# Patient Record
Sex: Male | Born: 1970 | Hispanic: No | Marital: Married | Smoking: Never smoker
Health system: Southern US, Community
[De-identification: ages and names within clinical notes are randomized; demographics above are authoritative.]

## PROBLEM LIST (undated history)

## (undated) DIAGNOSIS — Z9889 Other specified postprocedural states: Secondary | ICD-10-CM

## (undated) DIAGNOSIS — W5512XA Struck by horse, initial encounter: Secondary | ICD-10-CM

## (undated) DIAGNOSIS — R112 Nausea with vomiting, unspecified: Secondary | ICD-10-CM

## (undated) DIAGNOSIS — M1990 Unspecified osteoarthritis, unspecified site: Secondary | ICD-10-CM

## (undated) HISTORY — PX: CYSTOSCOPY: SUR368

## (undated) HISTORY — PX: COSMETIC SURGERY: SHX468

## (undated) HISTORY — DX: Struck by horse, initial encounter: W55.12XA

---

## 2010-05-09 ENCOUNTER — Ambulatory Visit (INDEPENDENT_AMBULATORY_CARE_PROVIDER_SITE_OTHER): Payer: 59 | Admitting: Internal Medicine

## 2010-05-09 ENCOUNTER — Encounter: Payer: Self-pay | Admitting: Internal Medicine

## 2010-05-09 VITALS — BP 110/80 | HR 66 | Temp 98.9°F | Ht 68.5 in | Wt 172.0 lb

## 2010-05-09 DIAGNOSIS — N3943 Post-void dribbling: Secondary | ICD-10-CM

## 2010-05-09 DIAGNOSIS — Z0000 Encounter for general adult medical examination without abnormal findings: Secondary | ICD-10-CM

## 2010-05-09 DIAGNOSIS — M25569 Pain in unspecified knee: Secondary | ICD-10-CM

## 2010-05-09 LAB — HEPATIC FUNCTION PANEL
Albumin: 4.5 g/dL (ref 3.5–5.2)
Total Bilirubin: 0.8 mg/dL (ref 0.3–1.2)

## 2010-05-09 LAB — POCT URINALYSIS DIPSTICK
Blood, UA: NEGATIVE
Ketones, UA: NEGATIVE
Protein, UA: NEGATIVE
Spec Grav, UA: 1.005

## 2010-05-09 LAB — LIPID PANEL
Cholesterol: 161 mg/dL (ref 0–200)
LDL Cholesterol: 102 mg/dL — ABNORMAL HIGH (ref 0–99)
Total CHOL/HDL Ratio: 5
VLDL: 29 mg/dL (ref 0.0–40.0)

## 2010-05-09 LAB — TSH: TSH: 2.75 u[IU]/mL (ref 0.35–5.50)

## 2010-05-09 LAB — CBC WITH DIFFERENTIAL/PLATELET
Basophils Relative: 0.5 % (ref 0.0–3.0)
Eosinophils Relative: 2.6 % (ref 0.0–5.0)
HCT: 49.2 % (ref 39.0–52.0)
Hemoglobin: 17.5 g/dL — ABNORMAL HIGH (ref 13.0–17.0)
Lymphs Abs: 1.9 10*3/uL (ref 0.7–4.0)
Monocytes Relative: 5.2 % (ref 3.0–12.0)
Neutro Abs: 4.7 10*3/uL (ref 1.4–7.7)
WBC: 7.1 10*3/uL (ref 4.5–10.5)

## 2010-05-09 LAB — BASIC METABOLIC PANEL
BUN: 16 mg/dL (ref 6–23)
Creatinine, Ser: 1.2 mg/dL (ref 0.4–1.5)
GFR: 71.7 mL/min (ref >60.00)
Potassium: 4.4 mEq/L (ref 3.5–5.1)

## 2010-05-09 MED ORDER — TETANUS-DIPHTH-ACELL PERTUSSIS 5-2-15.5 LF-MCG/0.5 IM SUSP: 0.5000 mL | Freq: Once | INTRAMUSCULAR | Status: DC

## 2010-05-09 NOTE — Patient Instructions (Signed)
Will notify you  of labs when available.   Will notify you about urology   Referral. Check up in a year or prn.

## 2010-05-11 ENCOUNTER — Encounter: Payer: Self-pay | Admitting: Internal Medicine

## 2010-05-11 DIAGNOSIS — N3943 Post-void dribbling: Secondary | ICD-10-CM | POA: Insufficient documentation

## 2010-05-11 DIAGNOSIS — M25569 Pain in unspecified knee: Secondary | ICD-10-CM | POA: Insufficient documentation

## 2010-05-11 NOTE — Progress Notes (Signed)
  Subjective:    Patient ID: Sergio Hampton, male    DOB: 1970/06/03, 40 y.o.   MRN: [id number]  HPI  Pateint comes in today for first time visit  And to get commercial drivers license renewed . He has papre woirk with this. He is generally well and no hops surgeries or recent ed visits.   No labs for years.  No falls .  Has smoke detector and wears seat belts.  No firearms. No excess sun exposure uses ocass sun screen. Sees dentist regularly . No depression  Review of Systems Neg heent, pulmonary no cp sob , Gi negative for bowel change or blood.GU: some post void dribbling for a while getting worse. No pain or stream changes but sometime takes a long time to void.  No pain. No excess nocturia. No back problems. Neuro Neg for weakness or numbness.  Knee bothers him at times with squats no swelling or giving out.    Objective:   Physical Exam BP 110/80  Pulse 66  Temp(Src) 98.9 F (37.2 C) (Oral)  Ht 5' 8.5" (1.74 m)  Wt 172 lb (78.019 kg)  BMI 25.77 kg/m2  General Appearance:    Alert, cooperative, no distress, appears stated age  Head:    Normocephalic, without obvious abnormality, atraumatic  Eyes:    PERRL, conjunctiva/corneas clear, EOM's intact, both eyes       Ears:    Normal TM's and external ear canals, both ears  Nose:   Nares normal, septum midline, mucosa normal, no drainage   or sinus tenderness  Throat:   Lips, mucosa, and tongue normal; teeth and gums normal  Neck:   Supple, symmetrical, trachea midline, no adenopathy;       thyroid:  No enlargement/tenderness/nodules; no carotid   bruit or JVD  Back:     Symmetric, no curvature, ROM normal, no CVA tenderness  Lungs:     Clear to auscultation bilaterally, respirations unlabored  Chest wall:    No tenderness or deformity  Heart:    Regular rate and rhythm, S1 and S2 normal, no murmur, rub   or gallop  Abdomen:     Soft, non-tender, bowel sounds active all four quadrants,    no masses, no organomegaly  Genitalia:     Normal male without lesion, discharge or tenderness  Rectal:   defered  Extremities:   Extremities normal, atraumatic, no cyanosis or edema  Pulses:   2+ and symmetric all extremities  Skin:   Skin color, texture, turgor normal, no rashes or lesions  Lymph nodes:   Cervical, supraclavicular, and axillary nodes normal  Neurologic:   CNII-XII intact. Normal strength, sensation and reflexes      Throughout  Peripheral vision and whisper test done for drivers exam.          Assessment & Plan:  Preventive  Health Care  No restrictions Disc  lifestyle intervention healthy eating and exercise .  Labs today not fasting Dribbling   Seems more than normal    Disc options and will get  Opinion. Knee discussed prevention fu if  persistent or progressive

## 2010-05-15 ENCOUNTER — Encounter: Payer: Self-pay | Admitting: *Deleted

## 2010-05-16 ENCOUNTER — Ambulatory Visit: Payer: Self-pay | Admitting: Internal Medicine

## 2011-03-20 ENCOUNTER — Encounter (HOSPITAL_COMMUNITY): Payer: Self-pay | Admitting: Anesthesiology

## 2011-03-20 ENCOUNTER — Encounter (HOSPITAL_COMMUNITY): Admission: EM | Disposition: A | Discharge status: Home / Self Care | Payer: Self-pay | Source: Home / Self Care

## 2011-03-20 ENCOUNTER — Emergency Department (HOSPITAL_COMMUNITY): Payer: 59

## 2011-03-20 ENCOUNTER — Other Ambulatory Visit (INDEPENDENT_AMBULATORY_CARE_PROVIDER_SITE_OTHER): Payer: Self-pay | Admitting: General Surgery

## 2011-03-20 ENCOUNTER — Encounter (HOSPITAL_COMMUNITY): Payer: Self-pay | Admitting: *Deleted

## 2011-03-20 ENCOUNTER — Emergency Department (HOSPITAL_COMMUNITY): Payer: 59 | Admitting: Anesthesiology

## 2011-03-20 ENCOUNTER — Inpatient Hospital Stay (HOSPITAL_COMMUNITY)
Admission: EM | Admit: 2011-03-20 | Discharge: 2011-03-22 | DRG: 343 | Disposition: A | Discharge status: Home / Self Care | Payer: 59 | Attending: General Surgery | Admitting: General Surgery

## 2011-03-20 DIAGNOSIS — R509 Fever, unspecified: Secondary | ICD-10-CM | POA: Diagnosis present

## 2011-03-20 DIAGNOSIS — K3580 Unspecified acute appendicitis: Principal | ICD-10-CM | POA: Diagnosis present

## 2011-03-20 HISTORY — DX: Nausea with vomiting, unspecified: R11.2

## 2011-03-20 HISTORY — PX: LAPAROSCOPIC APPENDECTOMY: SHX408

## 2011-03-20 HISTORY — DX: Other specified postprocedural states: Z98.890

## 2011-03-20 HISTORY — DX: Unspecified osteoarthritis, unspecified site: M19.90

## 2011-03-20 LAB — BASIC METABOLIC PANEL
BUN: 15 mg/dL (ref 6–23)
Calcium: 9.3 mg/dL (ref 8.4–10.5)
GFR calc Af Amer: >90 mL/min (ref >90)
GFR calc non Af Amer: 78 mL/min — ABNORMAL LOW (ref >90)
Glucose, Bld: 107 mg/dL — ABNORMAL HIGH (ref 70–99)
Sodium: 135 mEq/L (ref 135–145)

## 2011-03-20 LAB — CBC
MCH: 30.5 pg (ref 26.0–34.0)
MCHC: 35.9 g/dL (ref 30.0–36.0)
MCV: 84.8 fL (ref 78.0–100.0)
Platelets: 163 10*3/uL (ref 150–400)
RBC: 5.61 MIL/uL (ref 4.22–5.81)

## 2011-03-20 LAB — DIFFERENTIAL
Basophils Relative: 0 % (ref 0–1)
Eosinophils Absolute: 0 10*3/uL (ref 0.0–0.7)
Eosinophils Relative: 0 % (ref 0–5)
Lymphs Abs: 0.8 10*3/uL (ref 0.7–4.0)
Monocytes Relative: 6 % (ref 3–12)

## 2011-03-20 LAB — URINALYSIS, ROUTINE W REFLEX MICROSCOPIC
Bilirubin Urine: NEGATIVE
Hgb urine dipstick: NEGATIVE
Ketones, ur: NEGATIVE mg/dL
Protein, ur: NEGATIVE mg/dL
Urobilinogen, UA: 1 mg/dL (ref 0.0–1.0)

## 2011-03-20 SURGERY — APPENDECTOMY, LAPAROSCOPIC
Anesthesia: General | Site: Abdomen | Wound class: Contaminated

## 2011-03-20 MED ORDER — MOXIFLOXACIN HCL IN NACL 400 MG/250ML IV SOLN
400.0000 mg | Freq: Once | INTRAVENOUS | Status: AC
  Administered 2011-03-20: 400 mg via INTRAVENOUS
  Filled 2011-03-20: qty 250

## 2011-03-20 MED ORDER — MIDAZOLAM HCL 5 MG/5ML IJ SOLN
INTRAMUSCULAR | Status: DC | PRN
  Administered 2011-03-20: 2 mg via INTRAVENOUS

## 2011-03-20 MED ORDER — ONDANSETRON HCL 4 MG/2ML IJ SOLN
INTRAMUSCULAR | Status: DC | PRN
  Administered 2011-03-20: 4 mg via INTRAVENOUS

## 2011-03-20 MED ORDER — LACTATED RINGERS IR SOLN
Status: DC | PRN
  Administered 2011-03-20: 2000 mL

## 2011-03-20 MED ORDER — KETOROLAC TROMETHAMINE 30 MG/ML IJ SOLN
INTRAMUSCULAR | Status: AC
  Filled 2011-03-20: qty 1

## 2011-03-20 MED ORDER — KCL IN DEXTROSE-NACL 20-5-0.9 MEQ/L-%-% IV SOLN
INTRAVENOUS | Status: DC
Start: 2011-03-20 — End: 2011-03-22
  Administered 2011-03-20 – 2011-03-22 (×4): via INTRAVENOUS
  Filled 2011-03-20 (×8): qty 1000

## 2011-03-20 MED ORDER — FENTANYL CITRATE 0.05 MG/ML IJ SOLN: 25.0000 ug | INTRAMUSCULAR | Status: DC | PRN

## 2011-03-20 MED ORDER — ROCURONIUM BROMIDE 100 MG/10ML IV SOLN
INTRAVENOUS | Status: DC | PRN
  Administered 2011-03-20: 10 mg via INTRAVENOUS
  Administered 2011-03-20: 2 mg via INTRAVENOUS
  Administered 2011-03-20: 28 mg via INTRAVENOUS

## 2011-03-20 MED ORDER — ONDANSETRON HCL 4 MG PO TABS: 4.0000 mg | ORAL_TABLET | Freq: Four times a day (QID) | ORAL | Status: DC | PRN

## 2011-03-20 MED ORDER — LIDOCAINE HCL (CARDIAC) 20 MG/ML IV SOLN
INTRAVENOUS | Status: DC | PRN
  Administered 2011-03-20: 80 mg via INTRAVENOUS

## 2011-03-20 MED ORDER — OXYCODONE-ACETAMINOPHEN 5-325 MG PO TABS
1.0000 | ORAL_TABLET | ORAL | Status: DC | PRN
  Administered 2011-03-21 – 2011-03-22 (×6): 1 via ORAL
  Filled 2011-03-20 (×6): qty 1
  Filled 2011-03-20: qty 2

## 2011-03-20 MED ORDER — GLYCOPYRROLATE 0.2 MG/ML IJ SOLN
INTRAMUSCULAR | Status: DC | PRN
  Administered 2011-03-20: .6 mg via INTRAVENOUS

## 2011-03-20 MED ORDER — BUPIVACAINE HCL (PF) 0.5 % IJ SOLN
INTRAMUSCULAR | Status: DC | PRN
  Administered 2011-03-20: 12 mL

## 2011-03-20 MED ORDER — FENTANYL CITRATE 0.05 MG/ML IJ SOLN
INTRAMUSCULAR | Status: DC | PRN
  Administered 2011-03-20: 50 ug via INTRAVENOUS
  Administered 2011-03-20 (×2): 100 ug via INTRAVENOUS

## 2011-03-20 MED ORDER — MORPHINE SULFATE 2 MG/ML IJ SOLN: 2.0000 mg | INTRAMUSCULAR | Status: DC | PRN

## 2011-03-20 MED ORDER — PROPOFOL 10 MG/ML IV BOLUS
INTRAVENOUS | Status: DC | PRN
  Administered 2011-03-20: 200 mg via INTRAVENOUS

## 2011-03-20 MED ORDER — SUCCINYLCHOLINE CHLORIDE 20 MG/ML IJ SOLN
INTRAMUSCULAR | Status: DC | PRN
  Administered 2011-03-20: 100 mg via INTRAVENOUS

## 2011-03-20 MED ORDER — ONDANSETRON HCL 4 MG/2ML IJ SOLN: 4.0000 mg | Freq: Four times a day (QID) | INTRAMUSCULAR | Status: DC | PRN

## 2011-03-20 MED ORDER — KETOROLAC TROMETHAMINE 30 MG/ML IJ SOLN
15.0000 mg | Freq: Once | INTRAMUSCULAR | Status: AC | PRN
  Administered 2011-03-20: 30 mg via INTRAVENOUS

## 2011-03-20 MED ORDER — IOHEXOL 300 MG/ML  SOLN
100.0000 mL | Freq: Once | INTRAMUSCULAR | Status: AC | PRN
  Administered 2011-03-20: 100 mL via INTRAVENOUS

## 2011-03-20 MED ORDER — BUPIVACAINE HCL (PF) 0.5 % IJ SOLN
INTRAMUSCULAR | Status: AC
  Filled 2011-03-20: qty 30

## 2011-03-20 MED ORDER — NEOSTIGMINE METHYLSULFATE 1 MG/ML IJ SOLN
INTRAMUSCULAR | Status: DC | PRN
  Administered 2011-03-20: 4 mg via INTRAVENOUS

## 2011-03-20 MED ORDER — MEPERIDINE HCL 25 MG/ML IJ SOLN: 6.2500 mg | INTRAMUSCULAR | Status: DC | PRN

## 2011-03-20 MED ORDER — SODIUM CHLORIDE 0.9 % IV SOLN
Freq: Once | INTRAVENOUS | Status: AC
  Administered 2011-03-20 (×3): via INTRAVENOUS

## 2011-03-20 SURGICAL SUPPLY — 39 items
APPLIER CLIP 5 13 M/L LIGAMAX5 (MISCELLANEOUS) ×2
APPLIER CLIP ROT 10 11.4 M/L (STAPLE)
BENZOIN TINCTURE PRP APPL 2/3 (GAUZE/BANDAGES/DRESSINGS) ×2 IMPLANT
CANISTER SUCTION 2500CC (MISCELLANEOUS) ×2 IMPLANT
CHLORAPREP W/TINT 26ML (MISCELLANEOUS) ×2 IMPLANT
CLIP APPLIE 5 13 M/L LIGAMAX5 (MISCELLANEOUS) ×1 IMPLANT
CLIP APPLIE ROT 10 11.4 M/L (STAPLE) IMPLANT
CLOTH BEACON ORANGE TIMEOUT ST (SAFETY) ×2 IMPLANT
CUTTER FLEX LINEAR 45M (STAPLE) ×2 IMPLANT
DECANTER SPIKE VIAL GLASS SM (MISCELLANEOUS) IMPLANT
DRAPE LAPAROSCOPIC ABDOMINAL (DRAPES) ×2 IMPLANT
DRAPE UTILITY XL STRL (DRAPES) ×2 IMPLANT
DRSG TEGADERM 2-3/8X2-3/4 SM (GAUZE/BANDAGES/DRESSINGS) ×6 IMPLANT
ELECT REM PT RETURN 9FT ADLT (ELECTROSURGICAL) ×2
ELECTRODE REM PT RTRN 9FT ADLT (ELECTROSURGICAL) ×1 IMPLANT
ENDOLOOP SUT PDS II  0 18 (SUTURE)
ENDOLOOP SUT PDS II 0 18 (SUTURE) IMPLANT
GLOVE BIOGEL PI IND STRL 7.0 (GLOVE) ×1 IMPLANT
GLOVE BIOGEL PI INDICATOR 7.0 (GLOVE) ×1
GLOVE ECLIPSE 8.0 STRL XLNG CF (GLOVE) ×2 IMPLANT
GLOVE INDICATOR 8.0 STRL GRN (GLOVE) ×4 IMPLANT
GOWN STRL NON-REIN LRG LVL3 (GOWN DISPOSABLE) ×2 IMPLANT
GOWN STRL REIN XL XLG (GOWN DISPOSABLE) ×4 IMPLANT
KIT BASIN OR (CUSTOM PROCEDURE TRAY) ×2 IMPLANT
PENCIL BUTTON HOLSTER BLD 10FT (ELECTRODE) IMPLANT
POUCH SPECIMEN RETRIEVAL 10MM (ENDOMECHANICALS) ×2 IMPLANT
RELOAD 45 VASCULAR/THIN (ENDOMECHANICALS) IMPLANT
RELOAD STAPLE TA45 3.5 REG BLU (ENDOMECHANICALS) ×2 IMPLANT
SCALPEL HARMONIC ACE (MISCELLANEOUS) IMPLANT
SET IRRIG TUBING LAPAROSCOPIC (IRRIGATION / IRRIGATOR) ×2 IMPLANT
SOLUTION ANTI FOG 6CC (MISCELLANEOUS) ×2 IMPLANT
STRIP CLOSURE SKIN 1/2X4 (GAUZE/BANDAGES/DRESSINGS) ×2 IMPLANT
SUT MNCRL AB 4-0 PS2 18 (SUTURE) ×2 IMPLANT
TOWEL OR 17X26 10 PK STRL BLUE (TOWEL DISPOSABLE) ×2 IMPLANT
TRAY FOLEY CATH 14FRSI W/METER (CATHETERS) ×2 IMPLANT
TRAY LAP CHOLE (CUSTOM PROCEDURE TRAY) ×2 IMPLANT
TROCAR BLADELESS OPT 5 75 (ENDOMECHANICALS) ×4 IMPLANT
TROCAR XCEL BLUNT TIP 100MML (ENDOMECHANICALS) ×2 IMPLANT
TUBING INSUFFLATION 10FT LAP (TUBING) ×2 IMPLANT

## 2011-03-20 NOTE — Interval H&P Note (Signed)
History and Physical Interval Note:  03/20/2011 7:26 PM  Sergio Hampton  has presented today for surgery, with the diagnosis of appendicitis  The various methods of treatment have been discussed with the patient and family. After consideration of risks, benefits and other options for treatment, the patient has consented to  Procedure(s): APPENDECTOMY LAPAROSCOPIC as a surgical intervention .  The patients' history has been reviewed, patient examined, no change in status, stable for surgery.  I have reviewed the patients' chart and labs.  Questions were answered to the patient's satisfaction.     [name] [name]

## 2011-03-20 NOTE — ED Provider Notes (Signed)
Gradual onset of right lower quadrant pain with moderate localized tenderness with imaging consistent with appendicitis  Sergio Horn, MD 03/21/11 1328

## 2011-03-20 NOTE — Progress Notes (Signed)
Called to patient room by nurse tech, found pt sitting on toilet attempting to urinate, pt having moderate generalized shivering/shaking. Pt denies pain, dizzy or lightheaded, states no other signs or symptoms. Pt answers yes to feeling cold, skin appears flush. Pt appears to be in no respiratory distress. Pt continues to have shaking, unsteady gait while ambulating with one assist to bed. Vital signs stables. Pt given warm blanket and noted decrease in shivering, while resting quietly in bed. AC notified to check on pt. Pt stable and in no distress or shivering at this time. Patients RN notified and to continue to moniter. Instructed pt to notify nursing if any changes.

## 2011-03-20 NOTE — ED Notes (Signed)
Pt reports "bloated" feeling yesterday which then moved to RLQ and has not resolved. N/V, fever yesterday. Has been taking ibuprofen at home.

## 2011-03-20 NOTE — Transfer of Care (Signed)
Immediate Anesthesia Transfer of Care Note  Patient: Sergio Hampton  Procedure(s) Performed:  APPENDECTOMY LAPAROSCOPIC  Patient Location: PACU  Anesthesia Type: General  Level of Consciousness: awake, alert , oriented and sedated  Airway & Oxygen Therapy: Patient Spontanous Breathing and Patient connected to face mask oxygen  Post-op Assessment: Report given to PACU RN and Post -op Vital signs reviewed and stable  Post vital signs: Reviewed and stable  Complications: No apparent anesthesia complications

## 2011-03-20 NOTE — Anesthesia Postprocedure Evaluation (Signed)
  Anesthesia Post-op Note  Patient: Sergio Hampton  Procedure(s) Performed:  APPENDECTOMY LAPAROSCOPIC  Patient Location: PACU  Anesthesia Type: General  Level of Consciousness: awake and alert   Airway and Oxygen Therapy: Patient Spontanous Breathing  Post-op Pain: mild  Post-op Assessment: Post-op Vital signs reviewed, Patient's Cardiovascular Status Stable, Respiratory Function Stable, Patent Airway and No signs of Nausea or vomiting  Post-op Vital Signs: stable  Complications: No apparent anesthesia complications

## 2011-03-20 NOTE — Progress Notes (Signed)
Surgery called for pt before admission history could be completed by admission rn. Pt's rn will need to complete admission history when pt arrives to nursing unit. Sergio Hampton 03/20/2011

## 2011-03-20 NOTE — ED Notes (Signed)
Patient is resting comfortably.family at bedside. IV fluids started.

## 2011-03-20 NOTE — Anesthesia Preprocedure Evaluation (Signed)
Anesthesia Evaluation  Patient identified by MRN, date of birth, ID band Patient awake    Reviewed: Allergy & Precautions, H&P , NPO status , Patient's Chart, lab work & pertinent test results  Airway Mallampati: II TM Distance: >3 FB Neck ROM: Full    Dental No notable dental hx. (+) Dental Advisory Given   Pulmonary neg pulmonary ROS,  clear to auscultation  Pulmonary exam normal       Cardiovascular neg cardio ROS Regular Normal    Neuro/Psych Negative Neurological ROS  Negative Psych ROS   GI/Hepatic negative GI ROS, Neg liver ROS,   Endo/Other  Negative Endocrine ROS  Renal/GU negative Renal ROS  Genitourinary negative   Musculoskeletal negative musculoskeletal ROS (+)   Abdominal   Peds negative pediatric ROS (+)  Hematology negative hematology ROS (+)   Anesthesia Other Findings   Reproductive/Obstetrics negative OB ROS                           Anesthesia Physical Anesthesia Plan  ASA: I and Emergent  Anesthesia Plan: General   Post-op Pain Management:    Induction: Intravenous and Rapid sequence  Airway Management Planned: Oral ETT  Additional Equipment:   Intra-op Plan:   Post-operative Plan: Extubation in OR  Informed Consent: I have reviewed the patients History and Physical, chart, labs and discussed the procedure including the risks, benefits and alternatives for the proposed anesthesia with the patient or authorized representative who has indicated his/her understanding and acceptance.   Dental advisory given  Plan Discussed with: CRNA  Anesthesia Plan Comments:         Anesthesia Quick Evaluation

## 2011-03-20 NOTE — ED Provider Notes (Signed)
History     CSN: [id number]  Arrival date & time 03/20/11  1529   First MD Initiated Contact with Patient 03/20/11 1552      Chief Complaint  Patient presents with  . Abdominal Pain  . Nausea    (Consider location/radiation/quality/duration/timing/severity/associated sxs/prior treatment) Patient is a 40 y.o. male presenting with abdominal pain. The history is provided by the patient.  Abdominal Pain The primary symptoms of the illness include abdominal pain, nausea and vomiting. The primary symptoms of the illness do not include fever, shortness of breath, diarrhea, hematemesis or dysuria. The current episode started more than 2 days ago. The onset of the illness was gradual. The problem has been gradually worsening.  Symptoms associated with the illness do not include chills, constipation, urgency, hematuria or frequency.  pt states 3 days ago developed diffuse abdominal pain, bloating, felt like gas. States today, pain localized to the right lower quadrant. Admits to nausea, vomiting x1 today, felt feverish. Deneis URI symptoms. Took ibuprofen today with no relief. Admits to no appetite. Normal BM yesterday, no urinary symptoms.   History reviewed. No pertinent past medical history.  Past Surgical History  Procedure Date  . Cosmetic surgery     on face    Family History  Problem Relation Age of Onset  . Arthritis Mother   . Autoimmune disease Mother     of liver  . Atrial fibrillation Father   . Thyroid cancer Father   . Lung cancer Father   . Asthma Brother     History  Substance Use Topics  . Smoking status: Never Smoker   . Smokeless tobacco: Not on file  . Alcohol Use: Yes     1x/week      Review of Systems  Constitutional: Negative for fever and chills.  HENT: Negative.   Eyes: Negative.   Respiratory: Negative.  Negative for shortness of breath.   Cardiovascular: Negative.   Gastrointestinal: Positive for nausea, vomiting and abdominal pain. Negative  for diarrhea, constipation, blood in stool and hematemesis.  Genitourinary: Negative for dysuria, urgency, frequency, hematuria, flank pain and testicular pain.  Musculoskeletal: Negative.   Skin: Negative.   Neurological: Negative.   Psychiatric/Behavioral: Negative.     Allergies  Review of patient's allergies indicates no known allergies.  Home Medications   Current Outpatient Rx  Name Route Sig Dispense Refill  . IBUPROFEN 200 MG PO TABS Oral Take 400 mg by mouth every 6 (six) hours as needed. For generic pain relief       BP 123/68  Pulse 92  Temp(Src) 99.2 F (37.3 C) (Oral)  Resp 18  SpO2 96%  Physical Exam  Nursing note and vitals reviewed. Constitutional: He is oriented to person, place, and time. He appears well-developed and well-nourished.  Neck: Neck supple.  Cardiovascular: Normal rate, regular rhythm and normal heart sounds.   Pulmonary/Chest: Effort normal and breath sounds normal.  Abdominal: Soft. Bowel sounds are normal. There is tenderness in the right lower quadrant. There is tenderness at McBurney's point. There is no rebound and no guarding.  Musculoskeletal: Normal range of motion. He exhibits no edema.  Neurological: He is alert and oriented to person, place, and time.  Skin: Skin is warm and dry. No rash noted.  Psychiatric: He has a normal mood and affect.    ED Course  Procedures (including critical care time) Exam concerning for possible appendicitis. Will do blood work, CT abd/pelvis for further evaluation.   Results for orders placed during  the hospital encounter of 03/20/11  CBC      Component Value Range   WBC 9.9  4.0 - 10.5 (K/uL)   RBC 5.61  4.22 - 5.81 (MIL/uL)   Hemoglobin 17.1 (*) 13.0 - 17.0 (g/dL)   HCT 16.1  09.6 - 04.5 (%)   MCV 84.8  78.0 - 100.0 (fL)   MCH 30.5  26.0 - 34.0 (pg)   MCHC 35.9  30.0 - 36.0 (g/dL)   RDW 40.9  81.1 - 91.4 (%)   Platelets 163  150 - 400 (K/uL)  DIFFERENTIAL      Component Value Range    Neutrophils Relative 86 (*) 43 - 77 (%)   Neutro Abs 8.5 (*) 1.7 - 7.7 (K/uL)   Lymphocytes Relative 8 (*) 12 - 46 (%)   Lymphs Abs 0.8  0.7 - 4.0 (K/uL)   Monocytes Relative 6  3 - 12 (%)   Monocytes Absolute 0.6  0.1 - 1.0 (K/uL)   Eosinophils Relative 0  0 - 5 (%)   Eosinophils Absolute 0.0  0.0 - 0.7 (K/uL)   Basophils Relative 0  0 - 1 (%)   Basophils Absolute 0.0  0.0 - 0.1 (K/uL)  BASIC METABOLIC PANEL      Component Value Range   Sodium 135  135 - 145 (mEq/L)   Potassium 3.3 (*) 3.5 - 5.1 (mEq/L)   Chloride 101  96 - 112 (mEq/L)   CO2 26  19 - 32 (mEq/L)   Glucose, Bld 107 (*) 70 - 99 (mg/dL)   BUN 15  6 - 23 (mg/dL)   Creatinine, Ser 7.82  0.50 - 1.35 (mg/dL)   Calcium 9.3  8.4 - 95.6 (mg/dL)   GFR calc non Af Amer 78 (*) >90 (mL/min)   GFR calc Af Amer >90  >90 (mL/min)  URINALYSIS, ROUTINE W REFLEX MICROSCOPIC      Component Value Range   Color, Urine YELLOW  YELLOW    APPearance CLEAR  CLEAR    Specific Gravity, Urine 1.039 (*) 1.005 - 1.030    pH 6.5  5.0 - 8.0    Glucose, UA NEGATIVE  NEGATIVE (mg/dL)   Hgb urine dipstick NEGATIVE  NEGATIVE    Bilirubin Urine NEGATIVE  NEGATIVE    Ketones, ur NEGATIVE  NEGATIVE (mg/dL)   Protein, ur NEGATIVE  NEGATIVE (mg/dL)   Urobilinogen, UA 1.0  0.0 - 1.0 (mg/dL)   Nitrite NEGATIVE  NEGATIVE    Leukocytes, UA NEGATIVE  NEGATIVE    Ct Abdomen Pelvis W Contrast  03/20/2011  *RADIOLOGY REPORT*  Clinical Data: Right lower quadrant pain.  CT ABDOMEN AND PELVIS WITH CONTRAST  Technique:  Multidetector CT imaging of the abdomen and pelvis was performed following the standard protocol during bolus administration of intravenous contrast.  Contrast: OMNIPAQUE IOHEXOL 300 MG/ML IV SOLN  Comparison: None.  Findings: Calcified granuloma is seen in the posterior liver. Liver and spleen are otherwise unremarkable.  The stomach, duodenum, pancreas, gallbladder, adrenal glands, and kidneys are unremarkable.  No abdominal aortic aneurysm.   No free fluid or lymphadenopathy in the abdomen.  Imaging through the pelvis shows no free intraperitoneal fluid.  No pelvic sidewall lymphadenopathy.  Bladder is normal.  No colonic diverticulitis.  The base to the terminal ileum is normal.  The appendix measures 11 mm in diameter.  There is periappendiceal edema/inflammation.  No extraluminal gas.  No abscess.  Bone windows reveal no worrisome lytic or sclerotic osseous lesions.  IMPRESSION: Imaging findings  consistent with acute appendicitis.  No evidence for extraluminal air.  No abscess.  I personally called these results directly to Jaynie Crumble at 1807 hours on 03/20/2011.  Original Report Authenticated By: ERIC A. MANSELL, M.D.    CT consistent with acute appendicitis. Spoke with Dr. Abbey Chatters, will be taking to surgery. Asked to give avelox 400mg  iv. Results discussed with pt.  MDM          Lottie Mussel, PA 03/21/11 [phone]

## 2011-03-20 NOTE — H&P (Signed)
Sergio Hampton is an 40 y.o. male.   Chief Complaint: Abdominal pain HPI:  This is a 40 year old male with the onset of diffuse pressure-like abdominal pain 2 days ago. The pain improved a little bit with a bowel movement. Yesterday, the pain migrated to the right lower quadrant and was associated with a fever and nausea and vomiting. The pain progressively worsened and he presented to the emergency department for evaluation. He was discovered to have acute appendicitis and I was asked to see him.  History reviewed. No pertinent past medical history.  Past Surgical History  Procedure Date  . Cosmetic surgery     on face    Family History  Problem Relation Age of Onset  . Arthritis Mother   . Autoimmune disease Mother     of liver  . Atrial fibrillation Father   . Thyroid cancer Father   . Lung cancer Father   . Asthma Brother    Social History:  reports that he has never smoked. He does not have any smokeless tobacco history on file. He reports that he drinks alcohol. He reports that he does not use illicit drugs.  Allergies: No Known Allergies  Medications Prior to Admission  Medication Dose Route Frequency Provider Last Rate Last Dose  . 0.9 %  sodium chloride infusion   Intravenous Once Tatyana A Kirichenko, PA 100 mL/hr at 03/20/11 1656    . iohexol (OMNIPAQUE) 300 MG/ML solution 100 mL  100 mL Intravenous Once PRN Medication Radiologist   100 mL at 03/20/11 1804  . moxifloxacin (AVELOX) IVPB 400 mg  400 mg Intravenous Once Tatyana A Kirichenko, PA   400 mg at 03/20/11 1847  . TDaP (ADACEL) injection 0.5 mL  0.5 mL Intramuscular Once Simmie Davies Panosh       No current outpatient prescriptions on file as of 03/20/2011.    Results for orders placed during the hospital encounter of 03/20/11 (from the past 48 hour(s))  CBC     Status: Abnormal   Collection Time   03/20/11  4:20 PM      Component Value Range Comment   WBC 9.9  4.0 - 10.5 (K/uL)    RBC 5.61  4.22 - 5.81  (MIL/uL)    Hemoglobin 17.1 (*) 13.0 - 17.0 (g/dL)    HCT 16.1  09.6 - 04.5 (%)    MCV 84.8  78.0 - 100.0 (fL)    MCH 30.5  26.0 - 34.0 (pg)    MCHC 35.9  30.0 - 36.0 (g/dL)    RDW 40.9  81.1 - 91.4 (%)    Platelets 163  150 - 400 (K/uL)   DIFFERENTIAL     Status: Abnormal   Collection Time   03/20/11  4:20 PM      Component Value Range Comment   Neutrophils Relative 86 (*) 43 - 77 (%)    Neutro Abs 8.5 (*) 1.7 - 7.7 (K/uL)    Lymphocytes Relative 8 (*) 12 - 46 (%)    Lymphs Abs 0.8  0.7 - 4.0 (K/uL)    Monocytes Relative 6  3 - 12 (%)    Monocytes Absolute 0.6  0.1 - 1.0 (K/uL)    Eosinophils Relative 0  0 - 5 (%)    Eosinophils Absolute 0.0  0.0 - 0.7 (K/uL)    Basophils Relative 0  0 - 1 (%)    Basophils Absolute 0.0  0.0 - 0.1 (K/uL)   BASIC METABOLIC PANEL     Status:  Abnormal   Collection Time   03/20/11  4:20 PM      Component Value Range Comment   Sodium 135  135 - 145 (mEq/L)    Potassium 3.3 (*) 3.5 - 5.1 (mEq/L)    Chloride 101  96 - 112 (mEq/L)    CO2 26  19 - 32 (mEq/L)    Glucose, Bld 107 (*) 70 - 99 (mg/dL)    BUN 15  6 - 23 (mg/dL)    Creatinine, Ser 1.61  0.50 - 1.35 (mg/dL)    Calcium 9.3  8.4 - 10.5 (mg/dL)    GFR calc non Af Amer 78 (*) >90 (mL/min)    GFR calc Af Amer >90  >90 (mL/min)   URINALYSIS, ROUTINE W REFLEX MICROSCOPIC     Status: Abnormal   Collection Time   03/20/11  6:32 PM      Component Value Range Comment   Color, Urine YELLOW  YELLOW     APPearance CLEAR  CLEAR     Specific Gravity, Urine 1.039 (*) 1.005 - 1.030     pH 6.5  5.0 - 8.0     Glucose, UA NEGATIVE  NEGATIVE (mg/dL)    Hgb urine dipstick NEGATIVE  NEGATIVE     Bilirubin Urine NEGATIVE  NEGATIVE     Ketones, ur NEGATIVE  NEGATIVE (mg/dL)    Protein, ur NEGATIVE  NEGATIVE (mg/dL)    Urobilinogen, UA 1.0  0.0 - 1.0 (mg/dL)    Nitrite NEGATIVE  NEGATIVE     Leukocytes, UA NEGATIVE  NEGATIVE  MICROSCOPIC NOT DONE ON URINES WITH NEGATIVE PROTEIN, BLOOD, LEUKOCYTES, NITRITE,  OR GLUCOSE <1000 mg/dL.   Ct Abdomen Pelvis W Contrast  03/20/2011  *RADIOLOGY REPORT*  Clinical Data: Right lower quadrant pain.  CT ABDOMEN AND PELVIS WITH CONTRAST  Technique:  Multidetector CT imaging of the abdomen and pelvis was performed following the standard protocol during bolus administration of intravenous contrast.  Contrast: OMNIPAQUE IOHEXOL 300 MG/ML IV SOLN  Comparison: None.  Findings: Calcified granuloma is seen in the posterior liver. Liver and spleen are otherwise unremarkable.  The stomach, duodenum, pancreas, gallbladder, adrenal glands, and kidneys are unremarkable.  No abdominal aortic aneurysm.  No free fluid or lymphadenopathy in the abdomen.  Imaging through the pelvis shows no free intraperitoneal fluid.  No pelvic sidewall lymphadenopathy.  Bladder is normal.  No colonic diverticulitis.  The base to the terminal ileum is normal.  The appendix measures 11 mm in diameter.  There is periappendiceal edema/inflammation.  No extraluminal gas.  No abscess.  Bone windows reveal no worrisome lytic or sclerotic osseous lesions.  IMPRESSION: Imaging findings consistent with acute appendicitis.  No evidence for extraluminal air.  No abscess.  I personally called these results directly to Jaynie Crumble at 1807 hours on 03/20/2011.  Original Report Authenticated By: ERIC A. MANSELL, M.D.    Review of Systems  Constitutional: Positive for fever and chills.  HENT: Positive for neck pain (chronic). Negative for congestion and sore throat.   Respiratory: Negative for cough and shortness of breath.   Cardiovascular: Negative for chest pain.  Gastrointestinal: Positive for nausea, vomiting and abdominal pain. Negative for heartburn and constipation.  Genitourinary: Positive for frequency (chronic). Negative for dysuria and hematuria.  Neurological: Positive for headaches. Negative for seizures.  Endo/Heme/Allergies: Does not bruise/bleed easily.    Blood pressure 127/74, pulse  94, temperature 99.3 F (37.4 C), temperature source Oral, resp. rate 20, SpO2 99.00%. Physical Exam  Constitutional: He  appears well-developed and well-nourished. No distress.  HENT:  Head: Normocephalic and atraumatic.  Eyes: Conjunctivae and EOM are normal.  Neck: Neck supple. No tracheal deviation present.  Cardiovascular: Normal rate and regular rhythm.   No murmur heard. Respiratory: Effort normal and breath sounds normal.  GI: Soft. He exhibits distension (mild). He exhibits no mass. There is tenderness (rlq). There is guarding (rlq).  Musculoskeletal: Normal range of motion. He exhibits no edema.  Lymphadenopathy:    He has no cervical adenopathy.  Skin: Skin is warm and dry.  Psychiatric: He has a normal mood and affect. His behavior is normal.     Assessment/Plan Acute appendicitis without evidence of perforation.  Plan: Laparoscopic possible open appendectomy. Intravenous antibiotics.  I have explained the procedure, risks, and aftercare to him.  Risks include but are not limited to bleeding, infection, anesthesia, wound problems, axonal injury to surrounding organs. He seems to understand this and agrees with the plan.  Brean Carberry J 03/20/2011, 7:21 PM

## 2011-03-20 NOTE — Op Note (Signed)
Operative Note  Sergio Hampton male 40 y.o. 03/20/2011  PREOPERATIVE DX:  Acute appendicitis  POSTOPERATIVE DX:  Same  PROCEDURE:  Laparoscopic appendectomy         Surgeon: Adolph Pollack   Assistants: None  Anesthesia: General endotracheal anesthesia  Indications: This is a 40 year old male who presented to the emergency department with a two-day history of abdominal pain that began diffusely and radiated down to the right lower quadrant. He was found to have acute appendicitis and is now brought to the operating room for laparoscopic appendectomy.   Procedure Detail:  He was brought to the operating room placed supine on the operating table and a general anesthetic was administered. A Foley catheter was inserted. The hair and abdominal wall was clipped. The abdominal wall was widely sterilely prepped and draped.  Dilute Marcaine solution was infiltrated in the subumbilical region. A small subumbilical incision was made through the skin subcutaneous tissue fascia and peritoneum entered the peritoneal cavity under direct vision. A pursestring suture of 0 Vicryl was placed on the edges of the fascia. A Hassan trocar was introduced into the peritoneal cavity and pneumoperitoneum was created. The laparoscope was introduced and there is no underlying bleeding or organ injury. An acute inflammatory process in the right lower quadrant was noted with no evidence of perforation.  A 5 mm trocar was placed in the left lower quadrant and the right upper quadrant. Using blunt dissection, the appendix was separated from it an inflammatory lesion to the inferior mesenteric fat pad of the distal ileum. Using the harmonic scalpel, the mesentery of the appendix was divided down to the base of the cecum. Using an Endo GIA stapler, the appendix was amputated off the cecum with a small cuff of cecum. The appendix was placed in a retrieval bag and removed through the subumbilical incision. The subumbilical  port was replaced.  The right lower quadrant pelvic area were copiously irrigated with saline solution. A small amount of bleeding was noted from the staple line which was controlled with hemoclips. Further inspection of the staple line showed it to be hemostatic without evidence of leak. More irrigation was performed and the fluid evacuated.  The left lower quadrant trocar was removed and no bleeding was noted. The subumbilical trocar was removed and the fascial defect was closed by tightening up and tying down of Vicryl suture.  The CO2 gas was released and the 5 mm right upper quadrant trocar was removed.  All skin incisions were closed with 4-0 Monocryl subcuticular stitches followed by Steri-Strips and sterile dressings. He tolerated the procedure well without any apparent complications and was taken to the recovery room in satisfactory condition.    Estimated Blood Loss:  less than 100 mL         Drains: none          Blood Given: none          Specimens: Appendix        Complications:  * No complications entered in OR log *         Disposition: PACU - hemodynamically stable.         Condition: stable

## 2011-03-21 MED ORDER — ACETAMINOPHEN 325 MG PO TABS
650.0000 mg | ORAL_TABLET | Freq: Once | ORAL | Status: AC
  Administered 2011-03-21: 650 mg via ORAL
  Filled 2011-03-21: qty 2

## 2011-03-21 MED ORDER — SODIUM CHLORIDE 0.9 % IV SOLN
1.0000 g | Freq: Once | INTRAVENOUS | Status: AC
  Administered 2011-03-21: 1 g via INTRAVENOUS
  Filled 2011-03-21: qty 1

## 2011-03-21 NOTE — Progress Notes (Signed)
Notified by nurse tech that the patient's temperature was 102.9 F orally. Other vital signs were as follows: Pulse- 117 Respirations-22 Blood Pressure- 118/75 O2 sat- 100% on 2L nasal cannula.   Patient was alert and oriented, dressing sites were dry/intact, patient has no complaints of pain. Notified MD on call about patient's temperature. Was instructed to monitor by MD and will continue to do so.  Samara Snide [facility] 03/21/2011 1:41 AM

## 2011-03-21 NOTE — ED Provider Notes (Signed)
Medical screening examination/treatment/procedure(s) were conducted as a shared visit with non-physician practitioner(s) and myself.  I personally evaluated the patient during the encounter  Hurman Horn, MD 03/21/11 1315

## 2011-03-21 NOTE — Progress Notes (Signed)
1 Day Post-Op  Subjective: Sore this AM. Taking sips of clears but not much else yet. Voiding OK  Objective: Vital signs in last 24 hours: Temp:  [98 F (36.7 C)-102.9 F (39.4 C)] 99.9 F (37.7 C) (12/26 0546) Pulse Rate:  [84-117] 104  (12/26 0546) Resp:  [18-22] 20  (12/26 0546) BP: (105-137)/(67-82) 107/69 mmHg (12/26 0546) SpO2:  [94 %-100 %] 94 % (12/26 0546) Weight:  [172 lb (78.019 kg)-183 lb 10.3 oz (83.3 kg)] 183 lb 10.3 oz (83.3 kg) (12/25 2215) Last BM Date:  (PTA)  Intake/Output from previous day: 12/25 0701 - 12/26 0700 In: 2242 [I.V.:2242] Out: 500 [Urine:500] Intake/Output this shift:    General appearance: alert, mild distress and Looks mildly uncomfortable Resp: clear to auscultation bilaterally GI: Abdomen is soft, mildly distended, tender at incisions;dressing dry, occacional BS  Lab Results:   [facility] 03/20/11 1620  WBC 9.9  HGB 17.1*  HCT 47.6  PLT 163   BMET  Basename 03/20/11 1620  NA 135  K 3.3*  CL 101  CO2 26  GLUCOSE 107*  BUN 15  CREATININE 1.16  CALCIUM 9.3   PT/INR No results found for this basename: LABPROT:2,INR:2 in the last 72 hours ABG No results found for this basename: PHART:2,PCO2:2,PO2:2,HCO3:2 in the last 72 hours  Studies/Results: Ct Abdomen Pelvis W Contrast  03/20/2011  *RADIOLOGY REPORT*  Clinical Data: Right lower quadrant pain.  CT ABDOMEN AND PELVIS WITH CONTRAST  Technique:  Multidetector CT imaging of the abdomen and pelvis was performed following the standard protocol during bolus administration of intravenous contrast.  Contrast: OMNIPAQUE IOHEXOL 300 MG/ML IV SOLN  Comparison: None.  Findings: Calcified granuloma is seen in the posterior liver. Liver and spleen are otherwise unremarkable.  The stomach, duodenum, pancreas, gallbladder, adrenal glands, and kidneys are unremarkable.  No abdominal aortic aneurysm.  No free fluid or lymphadenopathy in the abdomen.  Imaging through the pelvis shows no free  intraperitoneal fluid.  No pelvic sidewall lymphadenopathy.  Bladder is normal.  No colonic diverticulitis.  The base to the terminal ileum is normal.  The appendix measures 11 mm in diameter.  There is periappendiceal edema/inflammation.  No extraluminal gas.  No abscess.  Bone windows reveal no worrisome lytic or sclerotic osseous lesions.  IMPRESSION: Imaging findings consistent with acute appendicitis.  No evidence for extraluminal air.  No abscess.  I personally called these results directly to Jaynie Crumble at 1807 hours on 03/20/2011.  Original Report Authenticated By: ERIC A. MANSELL, M.D.    Anti-infectives: Anti-infectives     Start     Dose/Rate Route Frequency Ordered Stop   03/20/11 1830   moxifloxacin (AVELOX) IVPB 400 mg        400 mg 250 mL/hr over 60 Minutes Intravenous  Once 03/20/11 1824 03/20/11 1947          Assessment/Plan: s/p Procedure(s): APPENDECTOMY LAPAROSCOPIC Advance diet Will need to keep today. Had 102 fever so will continue antibiotic for 24H post op  LOS: 1 day    Seila Liston J 03/21/2011

## 2011-03-21 NOTE — Progress Notes (Signed)
Was called to patient's room to assess pt's shivering/shaking. Pt was laying in bed and the shivering/shaking was moderate, shaking the bed as well. Vital signs were stable, heart rate was 112. Patient was alert and oriented, no complaints of pain. Notified MD on call, and received an order for 650 mg Tylenol POx1. Notified patient, and will continue to monitor after giving PO Tylenol.  Samara Snide [facility] 03/21/2011

## 2011-03-22 ENCOUNTER — Telehealth (INDEPENDENT_AMBULATORY_CARE_PROVIDER_SITE_OTHER): Payer: Self-pay

## 2011-03-22 ENCOUNTER — Telehealth: Payer: Self-pay | Admitting: Internal Medicine

## 2011-03-22 ENCOUNTER — Encounter (HOSPITAL_COMMUNITY): Payer: Self-pay | Admitting: General Surgery

## 2011-03-22 MED ORDER — INFLUENZA VIRUS VACC SPLIT PF IM SUSP
0.5000 mL | INTRAMUSCULAR | Status: AC | PRN
  Administered 2011-03-22: 0.5 mL via INTRAMUSCULAR
  Filled 2011-03-22: qty 0.5

## 2011-03-22 MED ORDER — OXYCODONE-ACETAMINOPHEN 5-325 MG PO TABS: 1.0000 | ORAL_TABLET | ORAL | Status: AC | PRN

## 2011-03-22 MED ORDER — AMOXICILLIN-POT CLAVULANATE 875-125 MG PO TABS: 1.0000 | ORAL_TABLET | Freq: Two times a day (BID) | ORAL | Status: AC

## 2011-03-22 NOTE — Telephone Encounter (Signed)
Pt had emergency surgery  appendix removal on christmas. Pt need hos fup, where can I work pt in?

## 2011-03-22 NOTE — Telephone Encounter (Signed)
Called pt and gave him pathology results.

## 2011-03-22 NOTE — Discharge Summary (Signed)
Patient ID: Sergio Hampton MRN: [id number] DOB/AGE: Aug 20, 1970 40 y.o.  Admit date: 03/20/2011 Discharge date: 03/22/2011  Procedures:  Laparoscopic appendectomy  Consults: none  Reason for Admission:  41 yo with a couple days of abd pain.  It became more severe and presented to the [facility] where he was found to have acute appendicitis.  Admission Diagnoses: 1. Acute appendicitis  Hospital Course: The patient was admitted and given a dose of IV abx.  He was then taken to the operating room where he underwent a laparoscopic appendectomy.  He tolerated the procedure well.  He was given 2 doses of post op abx.  On POD# 1 he was still having significant fevers of 101-102.  He was kept and his diet was advanced.  By POD# 2, he was eating fairly well.  He had 2 BMs and fevers were significantly down.  He was felt stable for d/c home.  PE: Abd: soft, mild bloating, +BS, tender appropriately.  Incisions c/d/i with gauze and tegaderm.  Discharge Diagnoses:  Active Problems:  Acute appendicitis   Discharge Medications: Current Discharge Medication List    START taking these medications   Details  amoxicillin-clavulanate (AUGMENTIN) 875-125 MG per tablet Take 1 tablet by mouth 2 (two) times daily. Qty: 10 tablet, Refills: 0    oxyCODONE-acetaminophen (PERCOCET) 5-325 MG per tablet Take 1-2 tablets by mouth every 4 (four) hours as needed. Qty: 40 tablet, Refills: 0      CONTINUE these medications which have NOT CHANGED   Details  ibuprofen (ADVIL,MOTRIN) 200 MG tablet Take 400 mg by mouth every 6 (six) hours as needed. For generic pain relief         Discharge Instructions: Follow-up Information    Follow up with [facility].      Follow up with [facility] on 04/03/2011. (1:50pm, please arrive at 1:30pm)    Contact information:   [address] [place] [phone]         Signed: Letha Cape 03/22/2011, 8:42 AM

## 2011-03-23 NOTE — Telephone Encounter (Signed)
Usually dont need to see pcp  but should have a fu with surgeon  After getting out for surgery .unless having a medical problem.   Is he having a medical problem that we need to follow up?( i cant see any discharge summary in the ehr  )  If not recheck when due  For reg PV .  Let us know in what way we need to be helping.

## 2011-03-28 NOTE — Telephone Encounter (Signed)
Left message about below.

## 2011-03-28 NOTE — Telephone Encounter (Signed)
Left message to call back  

## 2011-04-03 ENCOUNTER — Encounter (INDEPENDENT_AMBULATORY_CARE_PROVIDER_SITE_OTHER): Payer: Self-pay | Admitting: Radiology

## 2011-04-03 ENCOUNTER — Ambulatory Visit (INDEPENDENT_AMBULATORY_CARE_PROVIDER_SITE_OTHER): Payer: 59 | Admitting: Radiology

## 2011-04-03 VITALS — BP 120/82 | Ht 69.0 in | Wt 175.0 lb

## 2011-04-03 DIAGNOSIS — K37 Unspecified appendicitis: Secondary | ICD-10-CM

## 2011-04-03 NOTE — Progress Notes (Signed)
Romel Judie Petit [facility] 06/20/1970 [id number] 04/03/2011   History of Present Illness: Sergio Hampton is a  41 y.o. male who presents today status post lap appy.  Pathology reveals appendectomy.  The patient is tolerating a regular diet, having normal bowel movements, has good pain control.  He is back to most normal activities.   Physical Exam: Abd: soft, nontender, active bowel sounds, nondistended.  All incisions are well healed.  Impression: 1.  Acute appendicitis, s/p lap appy  Plan: He is able to return to normal activities. He may follow up on a prn basis.

## 2011-04-17 ENCOUNTER — Ambulatory Visit: Payer: 59 | Admitting: Internal Medicine

## 2011-07-18 ENCOUNTER — Other Ambulatory Visit: Payer: 59

## 2011-07-25 ENCOUNTER — Ambulatory Visit (INDEPENDENT_AMBULATORY_CARE_PROVIDER_SITE_OTHER): Payer: 59 | Admitting: Internal Medicine

## 2011-07-25 ENCOUNTER — Encounter: Payer: Self-pay | Admitting: Internal Medicine

## 2011-07-25 VITALS — BP 120/82 | HR 68 | Temp 97.8°F | Ht 68.0 in | Wt 173.0 lb

## 2011-07-25 DIAGNOSIS — Z0000 Encounter for general adult medical examination without abnormal findings: Secondary | ICD-10-CM

## 2011-07-25 LAB — POCT URINALYSIS DIPSTICK
Blood, UA: NEGATIVE
Glucose, UA: NEGATIVE
Nitrite, UA: NEGATIVE
Protein, UA: NEGATIVE
Spec Grav, UA: 1.01
Urobilinogen, UA: 0.2
pH, UA: 7

## 2011-07-25 LAB — HEPATIC FUNCTION PANEL
Alkaline Phosphatase: 73 U/L (ref 39–117)
Bilirubin, Direct: 0.1 mg/dL (ref 0.0–0.3)
Total Bilirubin: 0.9 mg/dL (ref 0.3–1.2)
Total Protein: 7.4 g/dL (ref 6.0–8.3)

## 2011-07-25 LAB — BASIC METABOLIC PANEL
BUN: 15 mg/dL (ref 6–23)
Creatinine, Ser: 1.2 mg/dL (ref 0.4–1.5)
GFR: 69.26 mL/min (ref >60.00)

## 2011-07-25 LAB — LIPID PANEL
Cholesterol: 187 mg/dL (ref 0–200)
HDL: 40.2 mg/dL (ref >39.00)
LDL Cholesterol: 115 mg/dL — ABNORMAL HIGH (ref 0–99)
Total CHOL/HDL Ratio: 5
Triglycerides: 159 mg/dL — ABNORMAL HIGH (ref 0.0–149.0)
VLDL: 31.8 mg/dL (ref 0.0–40.0)

## 2011-07-25 LAB — CBC WITH DIFFERENTIAL/PLATELET
Basophils Relative: 0.4 % (ref 0.0–3.0)
Eosinophils Absolute: 0.2 10*3/uL (ref 0.0–0.7)
Lymphocytes Relative: 31.8 % (ref 12.0–46.0)
MCHC: 33.8 g/dL (ref 30.0–36.0)
Monocytes Absolute: 0.4 10*3/uL (ref 0.1–1.0)
Neutrophils Relative %: 58.6 % (ref 43.0–77.0)
Platelets: 196 10*3/uL (ref 150.0–400.0)
RBC: 5.82 Mil/uL — ABNORMAL HIGH (ref 4.22–5.81)
WBC: 6.9 10*3/uL (ref 4.5–10.5)

## 2011-07-25 NOTE — Progress Notes (Signed)
  Subjective:    Patient ID: Sergio Hampton, male    DOB: 1971-01-26, 41 y.o.   MRN: [id number]  HPI Patient comes in today for Preventive Health Care visit  Since last visit he had had appendectomy wihtout complications 12 12 Also sees Dr Isabel Caprice for the urinary problem . Felt to be a weak bladder problem and following  meds not helpful. Review of Systems ROS:  GEN/ HEENT: No fever, significant weight changes sweats headaches vision problems hearing changes, CV/ PULM; No chest pain shortness of breath cough, syncope,edema  change in exercise tolerance. GI /GU: No adominal pain, vomiting, change in bowel habits. No blood in the stool.SKIN/HEME: ,no acute skin rashes suspicious lesions or bleeding. No lymphadenopathy, nodules, masses.  NEURO/ PSYCH:  No neurologic signs such as weakness numbness. No depression anxiety. IMM/ Allergy: No unusual infections.  Allergy .  no REST of 12 system review negative except as per HPI  Past history family history social history reviewed in the electronic medical record.     Objective:   Physical Exam BP 120/82  Pulse 68  Temp(Src) 97.8 F (36.6 C) (Oral)  Ht 5\' 8"  (1.727 m)  Wt 173 lb (78.472 kg)  BMI 26.30 kg/m2  SpO2 97% Physical Exam: Vital signs reviewed WUJ:WJXB is a well-developed well-nourished alert cooperative  White male  who appears   stated age in no acute distress.  HEENT: normocephalic  traumatic , Eyes: PERRL EOM's full, conjunctiva clear, Nares: patent no deformity discharge or tenderness., Ears: no deformity EAC's clear TMs with normal landmarks. Mouth: clear OP, no lesions, edema.  Moist mucous membranes. Dentition in adequate repair. NECK: supple without masses, thyromegaly or bruits. CHEST/PULM:  Clear to auscultation and percussion breath sounds equal no wheeze , rales or rhonchi. No chest wall deformities or tenderness. CV: PMI is nondisplaced, S1 S2 no gallops, murmurs, rubs. Peripheral pulses are full without delay.No JVD  .  ABDOMEN: Bowel sounds normal nontender  No guard or rebound, no hepato splenomegal no CVA tenderness.  No hernia. Extremtities:  No clubbing cyanosis or edema, no acute joint swelling or redness no focal atrophy NEURO:  Oriented x3, cranial nerves 3-12 appear to be intact, no obvious focal weakness,gait within normal limits no abnormal reflexes or asymmetrical SKIN: No acute rashes normal turgor, color, no bruising or petechiae. PSYCH: Oriented, good eye contact, no obvious depression anxiety, cognition and judgment appear normal. LN:  No cervical axillary or inguinal adenopathy     Assessment & Plan:  Preventive Health Care Counseled regarding healthy nutrition, exercise, sleep, injury prevention, and healthy weight . Sunscreen skin cancer prevention etc.  Labs pending Up to date  on healthcare parameters per hx  Stool cards given to do if he wishes  Urinary issues  Following uro.

## 2011-07-25 NOTE — Patient Instructions (Addendum)
Continue lifestyle intervention healthy eating and exercise . Mediterranean diet is heart healthy.  Sun screen and hats and glasses to avoid sun related cancers and disease. Male in her hand and stool cards to check for microscopic blood as a screening for colon cancer if you which ;otherwise colonoscopy at age 41

## 2011-07-26 NOTE — Progress Notes (Signed)
Quick Note:  Pt aware ______ 

## 2012-07-25 ENCOUNTER — Ambulatory Visit (INDEPENDENT_AMBULATORY_CARE_PROVIDER_SITE_OTHER): Payer: 59 | Admitting: Internal Medicine

## 2012-07-25 ENCOUNTER — Encounter: Payer: Self-pay | Admitting: Internal Medicine

## 2012-07-25 VITALS — BP 112/72 | HR 64 | Temp 97.9°F | Ht 68.5 in | Wt 172.0 lb

## 2012-07-25 DIAGNOSIS — Z0000 Encounter for general adult medical examination without abnormal findings: Secondary | ICD-10-CM

## 2012-07-25 LAB — CBC WITH DIFFERENTIAL/PLATELET
Basophils Absolute: 0 10*3/uL (ref 0.0–0.1)
Eosinophils Relative: 3.2 % (ref 0.0–5.0)
HCT: 50.1 % (ref 39.0–52.0)
Lymphs Abs: 2.2 10*3/uL (ref 0.7–4.0)
Monocytes Relative: 7 % (ref 3.0–12.0)
Neutrophils Relative %: 57.5 % (ref 43.0–77.0)
Platelets: 182 10*3/uL (ref 150.0–400.0)
RDW: 12.7 % (ref 11.5–14.6)
WBC: 7 10*3/uL (ref 4.5–10.5)

## 2012-07-25 NOTE — Patient Instructions (Signed)
Continue lifestyle intervention healthy eating and exercise . Limit fast foods processed foods. As possible Get enough sleep.  Will notify you  of labs when available.  Preventive Care for Adults, Male A healthy lifestyle and preventive care can promote health and wellness. Preventive health guidelines for men include the following key practices:  A routine yearly physical is a good way to check with your caregiver about your health and preventative screening. It is a chance to share any concerns and updates on your health, and to receive a thorough exam.  Visit your dentist for a routine exam and preventative care every 6 months. Brush your teeth twice a day and floss once a day. Good oral hygiene prevents tooth decay and gum disease.  The frequency of eye exams is based on your age, health, family medical history, use of contact lenses, and other factors. Follow your caregiver's recommendations for frequency of eye exams.  Eat a healthy diet. Foods like vegetables, fruits, whole grains, low-fat dairy products, and lean protein foods contain the nutrients you need without too many calories. Decrease your intake of foods high in solid fats, added sugars, and salt. Eat the right amount of calories for you.Get information about a proper diet from your caregiver, if necessary.  Regular physical exercise is one of the most important things you can do for your health. Most adults should get at least 150 minutes of moderate-intensity exercise (any activity that increases your heart rate and causes you to sweat) each week. In addition, most adults need muscle-strengthening exercises on 2 or more days a week.  Maintain a healthy weight. The body mass index (BMI) is a screening tool to identify possible weight problems. It provides an estimate of body fat based on height and weight. Your caregiver can help determine your BMI, and can help you achieve or maintain a healthy weight.For adults 20 years and  older:  A BMI below 18.5 is considered underweight.  A BMI of 18.5 to 24.9 is normal.  A BMI of 25 to 29.9 is considered overweight.  A BMI of 30 and above is considered obese.  Maintain normal blood lipids and cholesterol levels by exercising and minimizing your intake of saturated fat. Eat a balanced diet with plenty of fruit and vegetables. Blood tests for lipids and cholesterol should begin at age 63 and be repeated every 5 years. If your lipid or cholesterol levels are high, you are over 50, or you are a high risk for heart disease, you may need your cholesterol levels checked more frequently.Ongoing high lipid and cholesterol levels should be treated with medicines if diet and exercise are not effective.  If you smoke, find out from your caregiver how to quit. If you do not use tobacco, do not start.  If you choose to drink alcohol, do not exceed 2 drinks per day. One drink is considered to be 12 ounces (355 mL) of beer, 5 ounces (148 mL) of wine, or 1.5 ounces (44 mL) of liquor.  Avoid use of street drugs. Do not share needles with anyone. Ask for help if you need support or instructions about stopping the use of drugs.  High blood pressure causes heart disease and increases the risk of stroke. Your blood pressure should be checked at least every 1 to 2 years. Ongoing high blood pressure should be treated with medicines, if weight loss and exercise are not effective.  If you are 76 to 41 years old, ask your caregiver if you should take  aspirin to prevent heart disease.  Diabetes screening involves taking a blood sample to check your fasting blood sugar level. This should be done once every 3 years, after age 81, if you are within normal weight and without risk factors for diabetes. Testing should be considered at a younger age or be carried out more frequently if you are overweight and have at least 1 risk factor for diabetes.  Colorectal cancer can be detected and often prevented.  Most routine colorectal cancer screening begins at the age of 31 and continues through age 74. However, your caregiver may recommend screening at an earlier age if you have risk factors for colon cancer. On a yearly basis, your caregiver may provide home test kits to check for hidden blood in the stool. Use of a small camera at the end of a tube, to directly examine the colon (sigmoidoscopy or colonoscopy), can detect the earliest forms of colorectal cancer. Talk to your caregiver about this at age 9, when routine screening begins. Direct examination of the colon should be repeated every 5 to 10 years through age 29, unless early forms of pre-cancerous polyps or small growths are found.  Hepatitis C blood testing is recommended for all people born from [birth year] through 1964 and any individual with known risks for hepatitis C.  Practice safe sex. Use condoms and avoid high-risk sexual practices to reduce the spread of sexually transmitted infections (STIs). STIs include gonorrhea, chlamydia, syphilis, trichomonas, herpes, HPV, and human immunodeficiency virus (HIV). Herpes, HIV, and HPV are viral illnesses that have no cure. They can result in disability, cancer, and death.  A one-time screening for abdominal aortic aneurysm (AAA) and surgical repair of large AAAs by sound wave imaging (ultrasonography) is recommended for ages 42 to 2 years who are current or former smokers.  Healthy men should no longer receive prostate-specific antigen (PSA) blood tests as part of routine cancer screening. Consult with your caregiver about prostate cancer screening.  Testicular cancer screening is not recommended for adult males who have no symptoms. Screening includes self-exam, caregiver exam, and other screening tests. Consult with your caregiver about any symptoms you have or any concerns you have about testicular cancer.  Use sunscreen with skin protection factor (SPF) of 30 or more. Apply sunscreen liberally and  repeatedly throughout the day. You should seek shade when your shadow is shorter than you. Protect yourself by wearing long sleeves, pants, a wide-brimmed hat, and sunglasses year round, whenever you are outdoors.  Once a month, do a whole body skin exam, using a mirror to look at the skin on your back. Notify your caregiver of new moles, moles that have irregular borders, moles that are larger than a pencil eraser, or moles that have changed in shape or color.  Stay current with required immunizations.  Influenza. You need a dose every fall (or winter). The composition of the flu vaccine changes each year, so being vaccinated once is not enough.  Pneumococcal polysaccharide. You need 1 to 2 doses if you smoke cigarettes or if you have certain chronic medical conditions. You need 1 dose at age 38 (or older) if you have never been vaccinated.  Tetanus, diphtheria, pertussis (Tdap, Td). Get 1 dose of Tdap vaccine if you are younger than age 105 years, are over 86 and have contact with an infant, are a Research scientist (physical sciences), or simply want to be protected from whooping cough. After that, you need a Td booster dose every 10 years. Consult your caregiver if  you have not had at least 3 tetanus and diphtheria-containing shots sometime in your life or have a deep or dirty wound.  HPV. This vaccine is recommended for males 13 through 41 years of age. This vaccine may be given to men 22 through 41 years of age who have not completed the 3 dose series. It is recommended for men through age 64 who have sex with men or whose immune system is weakened because of HIV infection, other illness, or medications. The vaccine is given in 3 doses over 6 months.  Measles, mumps, rubella (MMR). You need at least 1 dose of MMR if you were born in 1957 or later. You may also need a 2nd dose.  Meningococcal. If you are age 38 to 61 years and a Orthoptist living in a residence hall, or have one of several medical  conditions, you need to get vaccinated against meningococcal disease. You may also need additional booster doses.  Zoster (shingles). If you are age 65 years or older, you should get this vaccine.  Varicella (chickenpox). If you have never had chickenpox or you were vaccinated but received only 1 dose, talk to your caregiver to find out if you need this vaccine.  Hepatitis A. You need this vaccine if you have a specific risk factor for hepatitis A virus infection, or you simply wish to be protected from this disease. The vaccine is usually given as 2 doses, 6 to 18 months apart.  Hepatitis B. You need this vaccine if you have a specific risk factor for hepatitis B virus infection or you simply wish to be protected from this disease. The vaccine is given in 3 doses, usually over 6 months. Preventative Service / Frequency Ages 66 to 29  Blood pressure check.** / Every 1 to 2 years.  Lipid and cholesterol check.** / Every 5 years beginning at age 19.  Hepatitis C blood test.** / For any individual with known risks for hepatitis C.  Skin self-exam. / Monthly.  Influenza immunization.** / Every year.  Pneumococcal polysaccharide immunization.** / 1 to 2 doses if you smoke cigarettes or if you have certain chronic medical conditions.  Tetanus, diphtheria, pertussis (Tdap,Td) immunization. / A one-time dose of Tdap vaccine. After that, you need a Td booster dose every 10 years.  HPV immunization. / 3 doses over 6 months, if 26 and younger.  Measles, mumps, rubella (MMR) immunization. / You need at least 1 dose of MMR if you were born in 1957 or later. You may also need a 2nd dose.  Meningococcal immunization. / 1 dose if you are age 77 to 69 years and a Orthoptist living in a residence hall, or have one of several medical conditions, you need to get vaccinated against meningococcal disease. You may also need additional booster doses.  Varicella immunization.** / Consult your  caregiver.  Hepatitis A immunization.** / Consult your caregiver. 2 doses, 6 to 18 months apart.  Hepatitis B immunization.** / Consult your caregiver. 3 doses usually over 6 months. Ages 66 to 34  Blood pressure check.** / Every 1 to 2 years.  Lipid and cholesterol check.** / Every 5 years beginning at age 44.  Fecal occult blood test (FOBT) of stool. / Every year beginning at age 42 and continuing until age 25. You may not have to do this test if you get colonoscopy every 10 years.  Flexible sigmoidoscopy** or colonoscopy.** / Every 5 years for a flexible sigmoidoscopy or every 10 years  for a colonoscopy beginning at age 13 and continuing until age 8.  Hepatitis C blood test.** / For all people born from [birth year] through 1964 and any individual with known risks for hepatitis C.  Skin self-exam. / Monthly.  Influenza immunization.** / Every year.  Pneumococcal polysaccharide immunization.** / 1 to 2 doses if you smoke cigarettes or if you have certain chronic medical conditions.  Tetanus, diphtheria, pertussis (Tdap/Td) immunization.** / A one-time dose of Tdap vaccine. After that, you need a Td booster dose every 10 years.  Measles, mumps, rubella (MMR) immunization. / You need at least 1 dose of MMR if you were born in 1957 or later. You may also need a 2nd dose.  Varicella immunization.**/ Consult your caregiver.  Meningococcal immunization.** / Consult your caregiver.  Hepatitis A immunization.** / Consult your caregiver. 2 doses, 6 to 18 months apart.  Hepatitis B immunization.** / Consult your caregiver. 3 doses, usually over 6 months. Ages 10 and over  Blood pressure check.** / Every 1 to 2 years.  Lipid and cholesterol check.**/ Every 5 years beginning at age 24.  Fecal occult blood test (FOBT) of stool. / Every year beginning at age 4 and continuing until age 76. You may not have to do this test if you get colonoscopy every 10 years.  Flexible sigmoidoscopy** or  colonoscopy.** / Every 5 years for a flexible sigmoidoscopy or every 10 years for a colonoscopy beginning at age 3 and continuing until age 22.  Hepatitis C blood test.** / For all people born from [birth year] through 1964 and any individual with known risks for hepatitis C.  Abdominal aortic aneurysm (AAA) screening.** / A one-time screening for ages 53 to 14 years who are current or former smokers.  Skin self-exam. / Monthly.  Influenza immunization.** / Every year.  Pneumococcal polysaccharide immunization.** / 1 dose at age 30 (or older) if you have never been vaccinated.  Tetanus, diphtheria, pertussis (Tdap, Td) immunization. / A one-time dose of Tdap vaccine if you are over 65 and have contact with an infant, are a Research scientist (physical sciences), or simply want to be protected from whooping cough. After that, you need a Td booster dose every 10 years.  Varicella immunization. ** / Consult your caregiver.  Meningococcal immunization.** / Consult your caregiver.  Hepatitis A immunization. ** / Consult your caregiver. 2 doses, 6 to 18 months apart.  Hepatitis B immunization.** / Check with your caregiver. 3 doses, usually over 6 months. **Family history and personal history of risk and conditions may change your caregiver's recommendations. Document Released: 05/08/2001 Document Revised: 06/04/2011 Document Reviewed: 08/07/2010 [facility] Patient Information 2013 [place], [place].

## 2012-07-25 NOTE — Progress Notes (Signed)
Chief Complaint  Patient presents with  . Annual Exam    HPI: Patient comes in today for Preventive Health Care visit  No major change in health status since last visit . No concerns did have toe injury with jammed right great toe from horse  4 weeks ago getting better some tenderness on rom  ROS:  GEN/ HEENT: No fever, significant weight changes sweats headaches vision problems hearing changes, CV/ PULM; No chest pain shortness of breath cough, syncope,edema  change in exercise tolerance. GI /GU: No adominal pain, vomiting, change in bowel habits. No blood in the stool. No significant GU symptoms. SKIN/HEME: ,no acute skin rashes suspicious lesions or bleeding. No lymphadenopathy, nodules, masses.  NEURO/ PSYCH:  No neurologic signs such as weakness numbness. No depression anxiety. IMM/ Allergy: No unusual infections.  Allergy .   REST of 12 system review negative except as per HPI   Past Medical History  Diagnosis Date  . Arthritis   . PONV (postoperative nausea and vomiting)   . Acute appendicitis 03/20/2011    Family History  Problem Relation Age of Onset  . Arthritis Mother   . Autoimmune disease Mother     of liver  . Atrial fibrillation Father   . Thyroid cancer Father   . Lung cancer Father   . Asthma Brother    Past Surgical History  Procedure Laterality Date  . Cosmetic surgery      on face  . Cystoscopy    . Laparoscopic appendectomy  03/20/2011    Procedure: APPENDECTOMY LAPAROSCOPIC;  Surgeon: Adolph Pollack, MD;  Location: [facility];  Service: General;  Laterality: N/A;    History   Social History  . Marital Status: Married    Spouse Name: N/A    Number of Children: N/A  . Years of Education: N/A   Social History Main Topics  . Smoking status: Never Smoker   . Smokeless tobacco: Never Used  . Alcohol Use: Yes     Comment: 1x/week  . Drug Use: No  . Sexually Active: None   Other Topics Concern  . 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  Outside  Ad horses.  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    Neg ETS Firearms        HS education             Outpatient Encounter Prescriptions as of 07/25/2012  Medication Sig Dispense Refill  . ibuprofen (ADVIL,MOTRIN) 200 MG tablet Take 400 mg by mouth every 6 (six) hours as needed. For generic pain relief        No facility-administered encounter medications on file as of 07/25/2012.    EXAM:  BP 112/72  Pulse 64  Temp(Src) 97.9 F (36.6 C) (Oral)  Ht 5' 8.5" (1.74 m)  Wt 172 lb (78.019 kg)  BMI 25.77 kg/m2  SpO2 98%  Body mass index is 25.77 kg/(m^2).  Physical Exam: Vital signs reviewed [id number] is a well-developed well-nourished alert cooperative   male who appears stated age in no acute distress.  HEENT: normocephalic atraumatic , Eyes: PERRL EOM's full, conjunctiva clear, Nares: paten,t no deformity discharge or tenderness., Ears: no deformity EAC's clear TMs with normal landmarks. Mouth: clear OP, no lesions, edema.  Moist mucous membranes. Dentition in adequate repair. NECK: supple without masses, thyromegaly or bruits. CHEST/PULM:  Clear to auscultation  and percussion breath sounds equal no wheeze , rales or rhonchi. No chest wall deformities or tenderness. CV: PMI is nondisplaced, S1 S2 no gallops, murmurs, rubs. Peripheral pulses are full without delay.No JVD .  ABDOMEN: Bowel sounds normal nontender  No guard or rebound, no hepato splenomegal no CVA tenderness.  No hernia. Extremtities:  No clubbing cyanosis or edema, no acute joint swelling or redness no focal atrophy NEURO:  Oriented x3, cranial nerves 3-12 appear to be intact, no obvious focal weakness,gait within normal limits no abnormal reflexes or asymmetrical SKIN: No acute rashes normal turgor, color, no bruising or petechiae. PSYCH: Oriented, good eye contact, no obvious depression anxiety, cognition and judgment  appear normal. LN: no cervical axillary inguinal adenopathy  Lab Results  Component Value Date   WBC 6.9 07/25/2011   HGB 17.7* 07/25/2011   HCT 52.3* 07/25/2011   PLT 196.0 07/25/2011   GLUCOSE 87 07/25/2011   CHOL 187 07/25/2011   TRIG 159.0* 07/25/2011   HDL 40.20 07/25/2011   LDLCALC 115* 07/25/2011   ALT 34 07/25/2011   AST 25 07/25/2011   NA 144 07/25/2011   K 4.3 07/25/2011   CL 106 07/25/2011   CREATININE 1.2 07/25/2011   BUN 15 07/25/2011   CO2 26 07/25/2011   TSH 2.61 07/25/2011    ASSESSMENT AND PLAN:  Discussed the following assessment and plan:  Visit for preventive health examination - Plan: Basic metabolic panel, CBC with Differential, Hepatic function panel, Lipid panel, TSH Counseled regarding healthy nutrition, exercise, sleep, injury prevention, calcium vit d and healthy weight .  Patient Care Team: Madelin Headings, MD as PCP - General (Internal Medicine) Patient Instructions  Continue lifestyle intervention healthy eating and exercise . Limit fast foods processed foods. As possible Get enough sleep.  Will notify you  of labs when available.  Preventive Care for Adults, Male A healthy lifestyle and preventive care can promote health and wellness. Preventive health guidelines for men include the following key practices:  A routine yearly physical is a good way to check with your caregiver about your health and preventative screening. It is a chance to share any concerns and updates on your health, and to receive a thorough exam.  Visit your dentist for a routine exam and preventative care every 6 months. Brush your teeth twice a day and floss once a day. Good oral hygiene prevents tooth decay and gum disease.  The frequency of eye exams is based on your age, health, family medical history, use of contact lenses, and other factors. Follow your caregiver's recommendations for frequency of eye exams.  Eat a healthy diet. Foods like vegetables, fruits, whole grains, low-fat dairy products,  and lean protein foods contain the nutrients you need without too many calories. Decrease your intake of foods high in solid fats, added sugars, and salt. Eat the right amount of calories for you.Get information about a proper diet from your caregiver, if necessary.  Regular physical exercise is one of the most important things you can do for your health. Most adults should get at least 150 minutes of moderate-intensity exercise (any activity that increases your heart rate and causes you to sweat) each week. In addition, most adults need muscle-strengthening exercises on 2 or more days a week.  Maintain a healthy weight. The body mass index (BMI) is a screening tool to identify possible weight problems. It provides an estimate of body fat based on height and weight. Your caregiver can help determine your BMI, and  can help you achieve or maintain a healthy weight.For adults 20 years and older:  A BMI below 18.5 is considered underweight.  A BMI of 18.5 to 24.9 is normal.  A BMI of 25 to 29.9 is considered overweight.  A BMI of 30 and above is considered obese.  Maintain normal blood lipids and cholesterol levels by exercising and minimizing your intake of saturated fat. Eat a balanced diet with plenty of fruit and vegetables. Blood tests for lipids and cholesterol should begin at age 31 and be repeated every 5 years. If your lipid or cholesterol levels are high, you are over 50, or you are a high risk for heart disease, you may need your cholesterol levels checked more frequently.Ongoing high lipid and cholesterol levels should be treated with medicines if diet and exercise are not effective.  If you smoke, find out from your caregiver how to quit. If you do not use tobacco, do not start.  If you choose to drink alcohol, do not exceed 2 drinks per day. One drink is considered to be 12 ounces (355 mL) of beer, 5 ounces (148 mL) of wine, or 1.5 ounces (44 mL) of liquor.  Avoid use of street  drugs. Do not share needles with anyone. Ask for help if you need support or instructions about stopping the use of drugs.  High blood pressure causes heart disease and increases the risk of stroke. Your blood pressure should be checked at least every 1 to 2 years. Ongoing high blood pressure should be treated with medicines, if weight loss and exercise are not effective.  If you are 51 to 41 years old, ask your caregiver if you should take aspirin to prevent heart disease.  Diabetes screening involves taking a blood sample to check your fasting blood sugar level. This should be done once every 3 years, after age 35, if you are within normal weight and without risk factors for diabetes. Testing should be considered at a younger age or be carried out more frequently if you are overweight and have at least 1 risk factor for diabetes.  Colorectal cancer can be detected and often prevented. Most routine colorectal cancer screening begins at the age of 26 and continues through age 33. However, your caregiver may recommend screening at an earlier age if you have risk factors for colon cancer. On a yearly basis, your caregiver may provide home test kits to check for hidden blood in the stool. Use of a small camera at the end of a tube, to directly examine the colon (sigmoidoscopy or colonoscopy), can detect the earliest forms of colorectal cancer. Talk to your caregiver about this at age 106, when routine screening begins. Direct examination of the colon should be repeated every 5 to 10 years through age 26, unless early forms of pre-cancerous polyps or small growths are found.  Hepatitis C blood testing is recommended for all people born from [birth year] through 1965 and any individual with known risks for hepatitis C.  Practice safe sex. Use condoms and avoid high-risk sexual practices to reduce the spread of sexually transmitted infections (STIs). STIs include gonorrhea, chlamydia, syphilis, trichomonas, herpes,  HPV, and human immunodeficiency virus (HIV). Herpes, HIV, and HPV are viral illnesses that have no cure. They can result in disability, cancer, and death.  A one-time screening for abdominal aortic aneurysm (AAA) and surgical repair of large AAAs by sound wave imaging (ultrasonography) is recommended for ages 19 to 9 years who are current or former smokers.  Healthy men should no longer receive prostate-specific antigen (PSA) blood tests as part of routine cancer screening. Consult with your caregiver about prostate cancer screening.  Testicular cancer screening is not recommended for adult males who have no symptoms. Screening includes self-exam, caregiver exam, and other screening tests. Consult with your caregiver about any symptoms you have or any concerns you have about testicular cancer.  Use sunscreen with skin protection factor (SPF) of 30 or more. Apply sunscreen liberally and repeatedly throughout the day. You should seek shade when your shadow is shorter than you. Protect yourself by wearing long sleeves, pants, a wide-brimmed hat, and sunglasses year round, whenever you are outdoors.  Once a month, do a whole body skin exam, using a mirror to look at the skin on your back. Notify your caregiver of new moles, moles that have irregular borders, moles that are larger than a pencil eraser, or moles that have changed in shape or color.  Stay current with required immunizations.  Influenza. You need a dose every fall (or winter). The composition of the flu vaccine changes each year, so being vaccinated once is not enough.  Pneumococcal polysaccharide. You need 1 to 2 doses if you smoke cigarettes or if you have certain chronic medical conditions. You need 1 dose at age 71 (or older) if you have never been vaccinated.  Tetanus, diphtheria, pertussis (Tdap, Td). Get 1 dose of Tdap vaccine if you are younger than age 18 years, are over 44 and have contact with an infant, are a Advertising copywriter, or simply want to be protected from whooping cough. After that, you need a Td booster dose every 10 years. Consult your caregiver if you have not had at least 3 tetanus and diphtheria-containing shots sometime in your life or have a deep or dirty wound.  HPV. This vaccine is recommended for males 13 through 41 years of age. This vaccine may be given to men 22 through 41 years of age who have not completed the 3 dose series. It is recommended for men through age 56 who have sex with men or whose immune system is weakened because of HIV infection, other illness, or medications. The vaccine is given in 3 doses over 6 months.  Measles, mumps, rubella (MMR). You need at least 1 dose of MMR if you were born in 1957 or later. You may also need a 2nd dose.  Meningococcal. If you are age 83 to 34 years and a Orthoptist living in a residence hall, or have one of several medical conditions, you need to get vaccinated against meningococcal disease. You may also need additional booster doses.  Zoster (shingles). If you are age 15 years or older, you should get this vaccine.  Varicella (chickenpox). If you have never had chickenpox or you were vaccinated but received only 1 dose, talk to your caregiver to find out if you need this vaccine.  Hepatitis A. You need this vaccine if you have a specific risk factor for hepatitis A virus infection, or you simply wish to be protected from this disease. The vaccine is usually given as 2 doses, 6 to 18 months apart.  Hepatitis B. You need this vaccine if you have a specific risk factor for hepatitis B virus infection or you simply wish to be protected from this disease. The vaccine is given in 3 doses, usually over 6 months. Preventative Service / Frequency Ages 38 to 64  Blood pressure check.** / Every 1 to 2 years.  Lipid and cholesterol check.** / Every 5 years beginning at age 57.  Hepatitis C blood test.** / For any individual with known  risks for hepatitis C.  Skin self-exam. / Monthly.  Influenza immunization.** / Every year.  Pneumococcal polysaccharide immunization.** / 1 to 2 doses if you smoke cigarettes or if you have certain chronic medical conditions.  Tetanus, diphtheria, pertussis (Tdap,Td) immunization. / A one-time dose of Tdap vaccine. After that, you need a Td booster dose every 10 years.  HPV immunization. / 3 doses over 6 months, if 26 and younger.  Measles, mumps, rubella (MMR) immunization. / You need at least 1 dose of MMR if you were born in 1957 or later. You may also need a 2nd dose.  Meningococcal immunization. / 1 dose if you are age 5 to 14 years and a Orthoptist living in a residence hall, or have one of several medical conditions, you need to get vaccinated against meningococcal disease. You may also need additional booster doses.  Varicella immunization.** / Consult your caregiver.  Hepatitis A immunization.** / Consult your caregiver. 2 doses, 6 to 18 months apart.  Hepatitis B immunization.** / Consult your caregiver. 3 doses usually over 6 months. Ages 18 to 70  Blood pressure check.** / Every 1 to 2 years.  Lipid and cholesterol check.** / Every 5 years beginning at age 82.  Fecal occult blood test (FOBT) of stool. / Every year beginning at age 64 and continuing until age 76. You may not have to do this test if you get colonoscopy every 10 years.  Flexible sigmoidoscopy** or colonoscopy.** / Every 5 years for a flexible sigmoidoscopy or every 10 years for a colonoscopy beginning at age 51 and continuing until age 47.  Hepatitis C blood test.** / For all people born from [birth year] through 1965 and any individual with known risks for hepatitis C.  Skin self-exam. / Monthly.  Influenza immunization.** / Every year.  Pneumococcal polysaccharide immunization.** / 1 to 2 doses if you smoke cigarettes or if you have certain chronic medical conditions.  Tetanus, diphtheria,  pertussis (Tdap/Td) immunization.** / A one-time dose of Tdap vaccine. After that, you need a Td booster dose every 10 years.  Measles, mumps, rubella (MMR) immunization. / You need at least 1 dose of MMR if you were born in 1957 or later. You may also need a 2nd dose.  Varicella immunization.**/ Consult your caregiver.  Meningococcal immunization.** / Consult your caregiver.  Hepatitis A immunization.** / Consult your caregiver. 2 doses, 6 to 18 months apart.  Hepatitis B immunization.** / Consult your caregiver. 3 doses, usually over 6 months. Ages 64 and over  Blood pressure check.** / Every 1 to 2 years.  Lipid and cholesterol check.**/ Every 5 years beginning at age 94.  Fecal occult blood test (FOBT) of stool. / Every year beginning at age 68 and continuing until age 43. You may not have to do this test if you get colonoscopy every 10 years.  Flexible sigmoidoscopy** or colonoscopy.** / Every 5 years for a flexible sigmoidoscopy or every 10 years for a colonoscopy beginning at age 79 and continuing until age 43.  Hepatitis C blood test.** / For all people born from [birth year] through 1964 and any individual with known risks for hepatitis C.  Abdominal aortic aneurysm (AAA) screening.** / A one-time screening for ages 68 to 72 years who are current or former smokers.  Skin self-exam. / Monthly.  Influenza immunization.** / Every year.  Pneumococcal  polysaccharide immunization.** / 1 dose at age 31 (or older) if you have never been vaccinated.  Tetanus, diphtheria, pertussis (Tdap, Td) immunization. / A one-time dose of Tdap vaccine if you are over 65 and have contact with an infant, are a Research scientist (physical sciences), or simply want to be protected from whooping cough. After that, you need a Td booster dose every 10 years.  Varicella immunization. ** / Consult your caregiver.  Meningococcal immunization.** / Consult your caregiver.  Hepatitis A immunization. ** / Consult your caregiver. 2  doses, 6 to 18 months apart.  Hepatitis B immunization.** / Check with your caregiver. 3 doses, usually over 6 months. **Family history and personal history of risk and conditions may change your caregiver's recommendations. Document Released: 05/08/2001 Document Revised: 06/04/2011 Document Reviewed: 08/07/2010 [facility] Patient Information 2013 [place], [place].      Neta Mends. Izabelle Daus M.D.   Health Maintenance  Topic Date Due  . Influenza Vaccine  11/24/2012  . Tetanus/tdap  05/09/2020   Health Maintenance Review

## 2012-07-28 LAB — HEPATIC FUNCTION PANEL
ALT: 22 U/L (ref 0–53)
AST: 25 U/L (ref 0–37)
Bilirubin, Direct: 0.1 mg/dL (ref 0.0–0.3)
Total Bilirubin: 0.8 mg/dL (ref 0.3–1.2)

## 2012-07-28 LAB — BASIC METABOLIC PANEL
BUN: 15 mg/dL (ref 6–23)
Calcium: 9.2 mg/dL (ref 8.4–10.5)
Creatinine, Ser: 1.2 mg/dL (ref 0.4–1.5)
GFR: 70.22 mL/min (ref >60.00)
Glucose, Bld: 82 mg/dL (ref 70–99)
Potassium: 4.1 mEq/L (ref 3.5–5.1)

## 2012-07-28 LAB — LIPID PANEL
Cholesterol: 178 mg/dL (ref 0–200)
LDL Cholesterol: 122 mg/dL — ABNORMAL HIGH (ref 0–99)
Total CHOL/HDL Ratio: 5
Triglycerides: 114 mg/dL (ref 0.0–149.0)
VLDL: 22.8 mg/dL (ref 0.0–40.0)

## 2012-09-21 ENCOUNTER — Emergency Department (HOSPITAL_COMMUNITY)
Admission: EM | Admit: 2012-09-21 | Discharge: 2012-09-21 | Disposition: A | Discharge status: Home / Self Care | Payer: 59 | Attending: Emergency Medicine | Admitting: Emergency Medicine

## 2012-09-21 ENCOUNTER — Encounter (HOSPITAL_COMMUNITY): Payer: Self-pay | Admitting: Emergency Medicine

## 2012-09-21 ENCOUNTER — Emergency Department (HOSPITAL_COMMUNITY): Payer: 59

## 2012-09-21 DIAGNOSIS — S59909A Unspecified injury of unspecified elbow, initial encounter: Secondary | ICD-10-CM | POA: Insufficient documentation

## 2012-09-21 DIAGNOSIS — M79602 Pain in left arm: Secondary | ICD-10-CM

## 2012-09-21 DIAGNOSIS — S6990XA Unspecified injury of unspecified wrist, hand and finger(s), initial encounter: Secondary | ICD-10-CM | POA: Insufficient documentation

## 2012-09-21 DIAGNOSIS — Y929 Unspecified place or not applicable: Secondary | ICD-10-CM | POA: Insufficient documentation

## 2012-09-21 DIAGNOSIS — Y9389 Activity, other specified: Secondary | ICD-10-CM | POA: Insufficient documentation

## 2012-09-21 DIAGNOSIS — S59919A Unspecified injury of unspecified forearm, initial encounter: Secondary | ICD-10-CM | POA: Insufficient documentation

## 2012-09-21 DIAGNOSIS — Z8739 Personal history of other diseases of the musculoskeletal system and connective tissue: Secondary | ICD-10-CM | POA: Insufficient documentation

## 2012-09-21 DIAGNOSIS — W64XXXA Exposure to other animate mechanical forces, initial encounter: Secondary | ICD-10-CM | POA: Insufficient documentation

## 2012-09-21 DIAGNOSIS — Z8719 Personal history of other diseases of the digestive system: Secondary | ICD-10-CM | POA: Insufficient documentation

## 2012-09-21 MED ORDER — PROMETHAZINE HCL 25 MG PO TABS: 25.0000 mg | ORAL_TABLET | Freq: Four times a day (QID) | ORAL | Status: DC | PRN

## 2012-09-21 MED ORDER — HYDROCODONE-ACETAMINOPHEN 5-325 MG PO TABS: 2.0000 | ORAL_TABLET | Freq: Four times a day (QID) | ORAL | Status: DC | PRN

## 2012-09-21 NOTE — ED Notes (Signed)
Pt c/o l forearm pain.  Reports he thinks he pulled something.  Reports 6/10 pain.

## 2012-09-21 NOTE — ED Provider Notes (Signed)
History    This chart was scribed for non-physician practitioner, Mora Bellman, PA-C, working with Hilario Quarry, MD by Melba Coon, ED Scribe. This patient was seen in room WTR8/WTR8 and the patient's care was started at 5:30PM.   CSN: [id number] Arrival date & time 09/21/12  1641  First MD Initiated Contact with Patient 09/21/12 1700     Chief Complaint  Patient presents with  . Arm Injury   (Consider location/radiation/quality/duration/timing/severity/associated sxs/prior Treatment) The history is provided by the patient. No language interpreter was used.   HPI Comments: Sergio Hampton is a 42 y.o. male who presents to the Emergency Department complaining of constant, moderate to severe left forearm pain with an onset 3 hours ago. He reports he was tending to his horses when one of the horses pushed against his arm awkwardly and he thinks he sprained or pulled it. He rated the severity of the pain as a 6/10 at triage. He reports a cramping sensation when he bends his arm but reports an aching sensation when he does not bend it and leaves it alone for a while. He has not taken any pain medications today. Not moving the arm alleviates the pain. His wife reports he has some pallor. No known allergies. No other pertinent medical symptoms.  Past Medical History  Diagnosis Date  . Arthritis   . PONV (postoperative nausea and vomiting)   . Acute appendicitis 03/20/2011   Past Surgical History  Procedure Laterality Date  . Cosmetic surgery      on face  . Cystoscopy    . Laparoscopic appendectomy  03/20/2011    Procedure: APPENDECTOMY LAPAROSCOPIC;  Surgeon: Adolph Pollack, MD;  Location: [facility];  Service: General;  Laterality: N/A;   Family History  Problem Relation Age of Onset  . Arthritis Mother   . Autoimmune disease Mother     of liver  . Atrial fibrillation Father   . Thyroid cancer Father   . Lung cancer Father   . Asthma Brother    History  Substance Use  Topics  . Smoking status: Never Smoker   . Smokeless tobacco: Never Used  . Alcohol Use: Yes     Comment: 1x/week    Review of Systems  Constitutional: Negative for appetite change and fatigue.  HENT: Negative for congestion, sinus pressure and ear discharge.   Eyes: Negative for discharge.  Respiratory: Negative for cough.   Cardiovascular: Negative for chest pain.  Gastrointestinal: Negative for abdominal pain and diarrhea.  Genitourinary: Negative for frequency and hematuria.  Musculoskeletal: Positive for myalgias. Negative for back pain.  Skin: Positive for pallor. Negative for rash.  Neurological: Negative for seizures and headaches.  Psychiatric/Behavioral: Negative for hallucinations.  All other systems reviewed and are negative.    Allergies  Review of patient's allergies indicates no known allergies.  Home Medications   Current Outpatient Rx  Name  Route  Sig  Dispense  Refill  . ibuprofen (ADVIL,MOTRIN) 200 MG tablet   Oral   Take 400 mg by mouth every 6 (six) hours as needed. For generic pain relief          . HYDROcodone-acetaminophen (NORCO/VICODIN) 5-325 MG per tablet   Oral   Take 2 tablets by mouth every 6 (six) hours as needed for pain.   6 tablet   0   . promethazine (PHENERGAN) 25 MG tablet   Oral   Take 1 tablet (25 mg total) by mouth every 6 (six) hours as  needed for nausea.   12 tablet   0    BP 114/78  Pulse 78  Temp(Src) 98.3 F (36.8 C) (Oral)  Resp 20  SpO2 100% Physical Exam  Nursing note and vitals reviewed. Constitutional: He is oriented to person, place, and time. He appears well-developed and well-nourished. No distress.  HENT:  Head: Normocephalic and atraumatic.  Right Ear: External ear normal.  Left Ear: External ear normal.  Nose: Nose normal.  Eyes: Conjunctivae are normal.  Neck: Normal range of motion. No tracheal deviation present.  Cardiovascular: Normal rate, regular rhythm and normal heart sounds.    Pulmonary/Chest: Effort normal and breath sounds normal. No stridor.  Abdominal: Soft. He exhibits no distension. There is no tenderness.  Musculoskeletal: Normal range of motion. He exhibits tenderness.  TTP posteromedial aspect of the left proximal forearm with pain with supination. Full ROM of the left shoulder and left wrist. NV intact.  Neurological: He is alert and oriented to person, place, and time.  Skin: Skin is warm and dry. He is not diaphoretic.  Psychiatric: He has a normal mood and affect. His behavior is normal.    ED Course  Procedures (including critical care time)  COORDINATION OF CARE:  5:35PM - left elbow XR and left forearm XR will be ordered for [facility]. He denies pain medications at this time.  6:45PM - imaging results reviewed and are unremarkable. He will be prescribed hydrocodone-acetaminophen and promethazine. He is ready for d/c.  Labs Reviewed - No data to display Dg Elbow Complete Left  09/21/2012   *RADIOLOGY REPORT*  Clinical Data: Pain throughout left forearm. The patient tried stopping a horse with his arm.  LEFT ELBOW - COMPLETE 3+ VIEW  Comparison: None.  Findings: There are no effusions and no fractures.  The joint spaces are normally maintained.  The bones are normal. There are no soft tissue abnormalities.  IMPRESSION: No acute skeletal trauma.   Original Report Authenticated By: Sander Radon, M.D.   Dg Forearm Left  09/21/2012   *RADIOLOGY REPORT*  Clinical Data: Injury.  LEFT FOREARM - 2 VIEW  Comparison: None.  Findings: The radius and ulna are intact.  No fractures.  No bone lesions.  The wrist joint and the elbow joint are normally spaced. There are no soft tissue abnormalities.  IMPRESSION: Normal left forearm.   Original Report Authenticated By: Sander Radon, M.D.   1. Arm pain, left     MDM  Patient presents with arm pain after having his elbow and forearm hyperextended trying to stop a horse while it was running. XR  negative for fracture. Neurovascularly intact, compartment soft. He was given a rx for norco which he may or may not choose to fill. Discussed rest, ice, elevation, compression, NSAIDs. Follow up with PCP. Return instructions given. Vital signs stable for discharge. Patient / Family / Caregiver informed of clinical course, understand medical decision-making process, and agree with plan.     I personally performed the services described in this documentation, which was scribed in my presence. The recorded information has been reviewed and is accurate.     Mora Bellman, PA-C 09/21/12 1853

## 2012-09-22 NOTE — ED Provider Notes (Signed)
History/physical exam/procedure(s) were performed by non-physician practitioner and as supervising physician I was immediately available for consultation/collaboration. I have reviewed all notes and am in agreement with care and plan.   Hilario Quarry, MD 09/22/12 [phone]

## 2012-09-29 ENCOUNTER — Encounter: Payer: Self-pay | Admitting: Internal Medicine

## 2012-09-29 ENCOUNTER — Ambulatory Visit (INDEPENDENT_AMBULATORY_CARE_PROVIDER_SITE_OTHER): Payer: 59 | Admitting: Internal Medicine

## 2012-09-29 VITALS — BP 120/80 | HR 69 | Temp 98.6°F | Wt 172.0 lb

## 2012-09-29 DIAGNOSIS — W5512XA Struck by horse, initial encounter: Secondary | ICD-10-CM

## 2012-09-29 DIAGNOSIS — IMO0001 Reserved for inherently not codable concepts without codable children: Secondary | ICD-10-CM

## 2012-09-29 DIAGNOSIS — S4992XS Unspecified injury of left shoulder and upper arm, sequela: Secondary | ICD-10-CM

## 2012-09-29 DIAGNOSIS — X58XXXS Exposure to other specified factors, sequela: Secondary | ICD-10-CM

## 2012-09-29 DIAGNOSIS — W5512XS Struck by horse, sequela: Secondary | ICD-10-CM

## 2012-09-29 DIAGNOSIS — S4990XA Unspecified injury of shoulder and upper arm, unspecified arm, initial encounter: Secondary | ICD-10-CM | POA: Insufficient documentation

## 2012-09-29 HISTORY — DX: Struck by horse, initial encounter: W55.12XA

## 2012-09-29 NOTE — Progress Notes (Signed)
Chief Complaint  Patient presents with  . Follow-up    hospital.    HPI:  Patient come in for follow up from ED visit June 29th . At that time he sustained an injury when trying to stop from hoarse from going through a gait that didn't stop. His left arm wrapped around the worse his neck he let go but his arm was hyperextended near the elbow. He did not hear a pop but felt a triple worth being feeling in his left arm. His pain became so severe he went to the emergency room was evaluated had negative  x-ray for fracture or was felt to have a soft tissue injury to be seen as needed was given a pain medicine but didn't take it because it causes nausea.  Since that time he has continued pain with external rotation of his arm some lifting tender to touch feels that his left biceps doesn't firm up when making a fist and still has some deformity appearing  He is very physically active does pushups denies any specific weakness or numbness or neurologic symptoms  ROS: See pertinent positives and negatives per HPI.  Past Medical History  Diagnosis Date  . Arthritis   . PONV (postoperative nausea and vomiting)   . Acute appendicitis 03/20/2011    Family History  Problem Relation Age of Onset  . Arthritis Mother   . Autoimmune disease Mother     of liver  . Atrial fibrillation Father   . Thyroid cancer Father   . Lung cancer Father   . Asthma Brother     History   Social History  . Marital Status: Married    Spouse Name: N/A    Number of Children: N/A  . Years of Education: N/A   Social History Main Topics  . Smoking status: Never Smoker   . Smokeless tobacco: Never Used  . Alcohol Use: Yes     Comment: 1x/week  . Drug Use: No  . Sexually Active: None   Other Topics Concern  . 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    Neg ETS  Firearms        HS education             Outpatient Encounter Prescriptions as of 09/29/2012  Medication Sig Dispense Refill  . [DISCONTINUED] HYDROcodone-acetaminophen (NORCO/VICODIN) 5-325 MG per tablet Take 2 tablets by mouth every 6 (six) hours as needed for pain.  6 tablet  0  . [DISCONTINUED] ibuprofen (ADVIL,MOTRIN) 200 MG tablet Take 400 mg by mouth every 6 (six) hours as needed. For generic pain relief       . [DISCONTINUED] promethazine (PHENERGAN) 25 MG tablet Take 1 tablet (25 mg total) by mouth every 6 (six) hours as needed for nausea.  12 tablet  0   No facility-administered encounter medications on file as of 09/29/2012.    EXAM:  BP 120/80  Pulse 69  Temp(Src) 98.6 F (37 C) (Oral)  Wt 172 lb (78.019 kg)  BMI 25.77 kg/m2  SpO2 98%  Body mass index is 25.77 kg/(m^2).  GENERAL: vitals reviewed and listed above, alert, oriented, appears well hydrated and in no acute distress HEENT: atraumatic, conjunctiva  clear, no obvious abnormalities on inspection of external nose and ears  MS: moves all extremities  left upper gastrotomy shows a slight deformity at the medial epicondyles there is a deep bruise distal to that with some tenderness but no mass. His biceps tendon did is enlarged not particularly tender but not symmetrical and shifted as compared to his right arm side. Neurologic appears intact PSYCH: pleasant and cooperative, no obvious depression or anxiety Ed visit reviewed ASSESSMENT AND PLAN:  Discussed the following assessment and plan:  Injury of arm, left, sequela - Contusion /  POss disruption of biceps aponeurosis or partial tear. refer  ortho arm  - Plan: Ambulatory referral to Hand Surgery  Struck by horse, sequela - Plan: Ambulatory referral to Hand Surgery  -Patient advised to return or notify health care team  if symptoms worsen or persist or new concerns arise.  Patient Instructions  Will arrange ortho appt.  About the left arm injury.    Ice  after activity no heavy lifting until seen .    Neta Mends. Adama Ferber M.D.

## 2012-09-29 NOTE — Patient Instructions (Signed)
Will arrange ortho appt.  About the left arm injury.    Ice after activity no heavy lifting until seen .

## 2012-10-06 ENCOUNTER — Ambulatory Visit: Payer: 59 | Admitting: Internal Medicine

## 2013-06-19 ENCOUNTER — Ambulatory Visit (INDEPENDENT_AMBULATORY_CARE_PROVIDER_SITE_OTHER): Payer: 59 | Admitting: Internal Medicine

## 2013-06-19 ENCOUNTER — Encounter: Payer: Self-pay | Admitting: Internal Medicine

## 2013-06-19 VITALS — BP 130/100 | Temp 98.1°F | Ht 68.5 in | Wt 170.0 lb

## 2013-06-19 DIAGNOSIS — Z2089 Contact with and (suspected) exposure to other communicable diseases: Secondary | ICD-10-CM

## 2013-06-19 DIAGNOSIS — J029 Acute pharyngitis, unspecified: Secondary | ICD-10-CM

## 2013-06-19 DIAGNOSIS — Z20818 Contact with and (suspected) exposure to other bacterial communicable diseases: Secondary | ICD-10-CM

## 2013-06-19 LAB — POCT RAPID STREP A (OFFICE): Rapid Strep A Screen: NEGATIVE

## 2013-06-19 MED ORDER — AMOXICILLIN 500 MG PO CAPS: 500.0000 mg | ORAL_CAPSULE | Freq: Two times a day (BID) | ORAL | Status: DC

## 2013-06-19 NOTE — Progress Notes (Signed)
Chief Complaint  Patient presents with  . Sore Throat    Started on Tuesday  . Headache  . Cough  . Chills  . Generalized Body Aches    HPI: Patient comes in today for SDA for  new problem evaluation. Onset sore throat and fever one day   Some? Minimal  congestion  .   Hurts to swallow in am. ? Mild  congestoin  Feverish one day , No nvd .low back pain with this achly  No hx of strep.  Son, school age dx with strep this week.     ROS: See pertinent positives and negatives per HPI. No sig cough hx of strep  No nvd rash  Past Medical History  Diagnosis Date  . Arthritis   . PONV (postoperative nausea and vomiting)   . Acute appendicitis 03/20/2011    Family History  Problem Relation Age of Onset  . Arthritis Mother   . Autoimmune disease Mother     of liver  . Atrial fibrillation Father   . Thyroid cancer Father   . Lung cancer Father   . Asthma Brother     History   Social History  . Marital Status: Married    Spouse Name: N/A    Number of Children: N/A  . Years of Education: N/A   Social History Main Topics  . Smoking status: Never Smoker   . Smokeless tobacco: Never Used  . Alcohol Use: Yes     Comment: 1x/week  . Drug Use: No  . Sexual Activity: None   Other Topics Concern  . 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  Outside  Ad horses.  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    Neg ETS Firearms        HS education             Outpatient Encounter Prescriptions as of 06/19/2013  Medication Sig  . amoxicillin (AMOXIL) 500 MG capsule Take 1 capsule (500 mg total) by mouth 2 (two) times daily.    EXAM:  BP 130/100  Temp(Src) 98.1 F (36.7 C) (Oral)  Ht 5' 8.5" (1.74 m)  Wt 170 lb (77.111 kg)  BMI 25.47 kg/m2  Body mass index is 25.47 kg/(m^2).  GENERAL: vitals reviewed and listed above, alert, oriented, appears well hydrated and in no acute distress  mildly ill  HEENT: atraumatic, conjunctiva  clear, no obvious abnormalities on inspection of external nose and ears OP : no lesion edema or exudate  Red 2+ ?face non tender  NECK: no obvious masses on inspection palpation no adenopathy  LUNGS: clear to auscultation bilaterally, no wheezes, rales or rhonchi, CV: HRRR, no clubbing cyanosis or edema nl cap refill  MS: moves all extremities without noticeable focal  abnormality PSYCH: pleasant and cooperative, no obvious depression or anxiety  ASSESSMENT AND PLAN:  Discussed the following assessment and plan:  Sore throat - Plan: POC Rapid Strep A, Throat culture ([facility])  Exposure to Streptococcal pharyngitis - Plan: POC Rapid Strep A, Throat culture Loney Loh([facility]) Certainly possible this is still viral but has very strong exposure it is a Friday has systemic achiness go ahead and take the amoxicillin pending the culture. Expectant management -Patient advised to return or notify health care team  if symptoms worsen ,persist or new concerns arise.  Patient Instructions  Because of strong exposure strep  Can begin antibiotic  Pending culture   Neta Mends. Mcihael Hinderman M.D.  Pre visit review using our clinic review tool, if applicable. No additional management support is needed unless otherwise documented below in the visit note.

## 2013-06-19 NOTE — Patient Instructions (Addendum)
Because of strong exposure strep    Can begin antibiotic  Pending culture

## 2013-06-21 LAB — CULTURE, GROUP A STREP: ORGANISM ID, BACTERIA: NORMAL

## 2013-06-23 ENCOUNTER — Encounter: Payer: Self-pay | Admitting: Internal Medicine

## 2013-06-23 ENCOUNTER — Ambulatory Visit (INDEPENDENT_AMBULATORY_CARE_PROVIDER_SITE_OTHER): Payer: 59 | Admitting: Internal Medicine

## 2013-06-23 ENCOUNTER — Ambulatory Visit (INDEPENDENT_AMBULATORY_CARE_PROVIDER_SITE_OTHER)
Admission: RE | Admit: 2013-06-23 | Discharge: 2013-06-23 | Disposition: A | Discharge status: Home / Self Care | Payer: 59 | Source: Ambulatory Visit | Attending: Internal Medicine | Admitting: Internal Medicine

## 2013-06-23 VITALS — BP 114/80 | HR 82 | Temp 98.7°F | Ht 68.5 in | Wt 170.0 lb

## 2013-06-23 DIAGNOSIS — R059 Cough, unspecified: Secondary | ICD-10-CM

## 2013-06-23 DIAGNOSIS — R509 Fever, unspecified: Secondary | ICD-10-CM

## 2013-06-23 DIAGNOSIS — R05 Cough: Secondary | ICD-10-CM

## 2013-06-23 DIAGNOSIS — J111 Influenza due to unidentified influenza virus with other respiratory manifestations: Secondary | ICD-10-CM

## 2013-06-23 DIAGNOSIS — R69 Illness, unspecified: Secondary | ICD-10-CM

## 2013-06-23 LAB — CBC WITH DIFFERENTIAL/PLATELET
BASOS PCT: 0.4 % (ref 0.0–3.0)
Basophils Absolute: 0 10*3/uL (ref 0.0–0.1)
EOS PCT: 1.5 % (ref 0.0–5.0)
Eosinophils Absolute: 0.2 10*3/uL (ref 0.0–0.7)
HEMATOCRIT: 48.2 % (ref 39.0–52.0)
Hemoglobin: 16.1 g/dL (ref 13.0–17.0)
LYMPHS ABS: 1.8 10*3/uL (ref 0.7–4.0)
Lymphocytes Relative: 18.1 % (ref 12.0–46.0)
MCHC: 33.5 g/dL (ref 30.0–36.0)
MCV: 89.4 fl (ref 78.0–100.0)
MONO ABS: 0.7 10*3/uL (ref 0.1–1.0)
Monocytes Relative: 7.3 % (ref 3.0–12.0)
Neutro Abs: 7.3 10*3/uL (ref 1.4–7.7)
Neutrophils Relative %: 72.7 % (ref 43.0–77.0)
Platelets: 222 10*3/uL (ref 150.0–400.0)
RBC: 5.39 Mil/uL (ref 4.22–5.81)
RDW: 12.9 % (ref 11.5–14.6)
WBC: 10.1 10*3/uL (ref 4.5–10.5)

## 2013-06-23 LAB — BASIC METABOLIC PANEL
BUN: 9 mg/dL (ref 6–23)
CHLORIDE: 104 meq/L (ref 96–112)
CO2: 28 meq/L (ref 19–32)
Calcium: 9.3 mg/dL (ref 8.4–10.5)
Creatinine, Ser: 1.2 mg/dL (ref 0.4–1.5)
GFR: 70.58 mL/min (ref >60.00)
Glucose, Bld: 92 mg/dL (ref 70–99)
POTASSIUM: 3.7 meq/L (ref 3.5–5.1)
Sodium: 141 mEq/L (ref 135–145)

## 2013-06-23 MED ORDER — OSELTAMIVIR PHOSPHATE 75 MG PO CAPS: 75.0000 mg | ORAL_CAPSULE | Freq: Two times a day (BID) | ORAL | Status: DC

## 2013-06-23 NOTE — Patient Instructions (Addendum)
Stop the amoxicillin . This is not helping at this time .  Labs and x ray cause of the course of this fever.  This could be flu but is still a bit atypical.  Will decide to begin tamiflu . Based on this .

## 2013-06-23 NOTE — Progress Notes (Signed)
Pre visit review using our clinic review tool, if applicable. No additional management support is needed unless otherwise documented below in the visit note.   Chief Complaint  Patient presents with  . Headache  . Cough  . Chills  . Generalized Body Aches    HPI: Patient comes in today for SDA for worsening problem evaluation. Temp 99 - 100.7  Some chills  Even after ibuprofen.   Some with body  Aches  Has headache and cough worsenging.   No vomiting . No change in vision   pressure on eyes. Dry hacking cough using otc  theraflu type meds   Ibuprofen 200 No neck stiffness pain  Fluids ok .   Sprite no v and di. " God awful cough ". Dry.  No tick bites or exposure  Rashes  Is outside but no woodsexposures   Child with strep is better .   ROS: See pertinent positives and negatives per HPI. No vd rash joint swelling vision changes      Past Medical History  Diagnosis Date  . Arthritis   . PONV (postoperative nausea and vomiting)   . Acute appendicitis 03/20/2011    Family History  Problem Relation Age of Onset  . Arthritis Mother   . Autoimmune disease Mother     of liver  . Atrial fibrillation Father   . Thyroid cancer Father   . Lung cancer Father   . Asthma Brother     History   Social History  . Marital Status: Married    Spouse Name: N/A    Number of Children: N/A  . Years of Education: N/A   Social History Main Topics  . Smoking status: Never Smoker   . Smokeless tobacco: Never Used  . Alcohol Use: Yes     Comment: 1x/week  . Drug Use: No  . Sexual Activity: None   Other Topics Concern  . 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    Neg ETS Firearms        HS education             Outpatient Encounter Prescriptions as of 06/23/2013  Medication Sig  . amoxicillin (AMOXIL) 500 MG capsule Take 1 capsule  (500 mg total) by mouth 2 (two) times daily.  Marland Kitchen oseltamivir (TAMIFLU) 75 MG capsule Take 1 capsule (75 mg total) by mouth 2 (two) times daily.    EXAM:  BP 114/80  Pulse 82  Temp(Src) 98.7 F (37.1 C) (Oral)  Ht 5' 8.5" (1.74 m)  Wt 170 lb (77.111 kg)  BMI 25.47 kg/m2  SpO2 98%  Body mass index is 25.47 kg/(m^2). WDWN in NAD  quiet respirations; nocongested  somewhat hoarse. Non toxic . ocass mild dry cough doesn't feel well  HEENT: Normocephalic ;atraumatic , Eyes;  PERRL, EOMs  Full, lids and conjunctiva clear,,Ears: no deformities, canals nl, TM landmarks normal, Nose: no deformity or discharge;face non tender Mouth : OP clear without lesion or edema . Minimal redness  No lesion Neck: Supple without adenopathy or masses or bruits Chest:  Clear to A&P without wheezes rales or rhonchi CV:  S1-S2 no gallops or murmurs peripheral perfusion is normal Skin :nl perfusion and no acute rashes  Abdomen:  Sof,t normal bowel sounds without hepatosplenomegaly, no guarding rebound or  masses no CVA tenderness  ASSESSMENT AND PLAN:  Discussed the following assessment and plan:  Fever, unspecified - Plan: Basic metabolic panel, CBC with Differential, DG Chest 2 View  Cough - Plan: DG Chest 2 View  Influenza-like illness Waxing and waning sx .   Had one day of fever last week and then gone and then  Acute sx this weekend last 2+ days.  Suppose this could be a new illness is an acute flu as this seems to be over the last couple days. Definitely stop the amoxicillin as clinical picture is not for strep and his throat culture was negative for strep. Consider influenza rule out pneumonia etc. other viruses close observation warranted. -Patient advised to return or notify health care team  if symptoms worsen ,persist or new concerns arise.  Patient Instructions  Stop the amoxicillin . This is not helping at this time .  Labs and x ray cause of the course of this fever.  This could be flu but  is still a bit atypical.  Will decide to begin tamiflu . Based on this .   Neta MendsWanda K. Panosh M.D.  Lab Results  Component Value Date   WBC 10.1 06/23/2013   HGB 16.1 06/23/2013   HCT 48.2 06/23/2013   PLT 222.0 06/23/2013   GLUCOSE 92 06/23/2013   CHOL 178 07/25/2012   TRIG 114.0 07/25/2012   HDL 32.80* 07/25/2012   LDLCALC 122* 07/25/2012   ALT 22 07/25/2012   AST 25 07/25/2012   NA 141 06/23/2013   K 3.7 06/23/2013   CL 104 06/23/2013   CREATININE 1.2 06/23/2013   BUN 9 06/23/2013   CO2 28 06/23/2013   TSH 1.82 07/25/2012

## 2013-09-15 ENCOUNTER — Encounter: Payer: Self-pay | Admitting: Internal Medicine

## 2013-09-15 ENCOUNTER — Ambulatory Visit (INDEPENDENT_AMBULATORY_CARE_PROVIDER_SITE_OTHER): Payer: 59 | Admitting: Internal Medicine

## 2013-09-15 VITALS — BP 110/78 | Temp 98.0°F | Ht 68.75 in | Wt 164.0 lb

## 2013-09-15 DIAGNOSIS — E786 Lipoprotein deficiency: Secondary | ICD-10-CM

## 2013-09-15 DIAGNOSIS — Z0000 Encounter for general adult medical examination without abnormal findings: Secondary | ICD-10-CM

## 2013-09-15 LAB — LIPID PANEL
CHOL/HDL RATIO: 5
Cholesterol: 175 mg/dL (ref 0–200)
HDL: 38.3 mg/dL — ABNORMAL LOW (ref >39.00)
LDL CALC: 115 mg/dL — AB (ref 0–99)
NONHDL: 136.7
Triglycerides: 110 mg/dL (ref 0.0–149.0)
VLDL: 22 mg/dL (ref 0.0–40.0)

## 2013-09-15 NOTE — Assessment & Plan Note (Addendum)
No changes  On a prn fu with uro.

## 2013-09-15 NOTE — Progress Notes (Signed)
Chief Complaint  Patient presents with  . Annual Exam    HPI: Patient comes in today for Preventive Health Care visit  No major change in health status since last visit .  Health Maintenance  Topic Date Due  . Influenza Vaccine  10/24/2013  . Tetanus/tdap  05/09/2020   Health Maintenance Review LIFESTYLE:  Exercise:  kinda active job 60 hours outside and physical also Tobacco/ETS:no Alcohol: 1 per week or less Sugar beverages: sweet tea, gatorade when hydrating.   Sleep: 6-8 hours. Drug use:  sunc screen yes  ROS:  GEN/ HEENT: No fever, significant weight changes sweats headaches vision problems hearing changes, CV/ PULM; No chest pain shortness of breath cough, syncope,edema  change in exercise tolerance. GI /GU: No adominal pain, vomiting, change in bowel habits. No blood in the stool. No significant GU symptoms. SKIN/HEME: ,no acute skin rashes suspicious lesions or bleeding. No lymphadenopathy, nodules, masses.  NEURO/ PSYCH:  No neurologic signs such as weakness numbness. No depression anxiety. IMM/ Allergy: No unusual infections.  Allergy .   REST of 12 system review negative except as per HPI   Past Medical History  Diagnosis Date  . Arthritis   . PONV (postoperative nausea and vomiting)   . Acute appendicitis 03/20/2011  . Struck by horse 09/29/2012   Past Surgical History  Procedure Laterality Date  . Cosmetic surgery      on face  . Cystoscopy    . Laparoscopic appendectomy  03/20/2011    Procedure: APPENDECTOMY LAPAROSCOPIC;  Surgeon: Odis Hollingshead, MD;  Location: [facility];  Service: General;  Laterality: N/A;     Family History  Problem Relation Age of Onset  . Arthritis Mother   . Autoimmune disease Mother     of liver  . Atrial fibrillation Father   . Thyroid cancer Father   . Lung cancer Father   . Asthma Brother     History   Social History  . Marital Status: Married    Spouse Name: N/A    Number of Children: N/A  . Years of  Education: N/A   Social History Main Topics  . Smoking status: Never Smoker   . Smokeless tobacco: Never Used  . Alcohol Use: Yes     Comment: 1x/week  . Drug Use: No  . Sexual Activity: None   Other Topics Concern  . 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  Outside  And horses.  8 hours sleep   [place]    needs Teacher, adult education. [place] about 60 - 70  Hours week.    Neg ETS Firearms        HS education                Outpatient Encounter Prescriptions as of 09/15/2013  Medication Sig  . [DISCONTINUED] amoxicillin (AMOXIL) 500 MG capsule Take 1 capsule (500 mg total) by mouth 2 (two) times daily.  . [DISCONTINUED] oseltamivir (TAMIFLU) 75 MG capsule Take 1 capsule (75 mg total) by mouth 2 (two) times daily.    EXAM:  BP 110/78  Temp(Src) 98 F (36.7 C) (Oral)  Ht 5' 8.75" (1.746 m)  Wt 164 lb (74.39 kg)  BMI 24.40 kg/m2  Body mass index is 24.4 kg/(m^2).  Physical Exam: Vital signs reviewed RCV:ELFY is a well-developed well-nourished alert cooperative   Male  who appearsr stated age in no acute distress.  HEENT: normocephalic atraumatic , Eyes: PERRL  EOM's full, conjunctiva clear, Nares: paten,t no deformity discharge or tenderness., Ears: no deformity EAC's clear TMs with normal landmarks. Mouth: clear OP, no lesions, edema.  Moist mucous membranes. Dentition in adequate repair. NECK: supple without masses, thyromegaly or bruits. CHEST/PULM:  Clear to auscultation and percussion breath sounds equal no wheeze , rales or rhonchi. No chest wall deformities or tenderness. CV: PMI is nondisplaced, S1 S2 no gallops, murmurs, rubs. Peripheral pulses are full without delay.No JVD .  ABDOMEN: Bowel sounds normal nontender  No guard or rebound, no hepato splenomegal no CVA tenderness.  No hernia. Extremtities:  No clubbing cyanosis or edema, no acute joint swelling or redness no focal atrophy NEURO:  Oriented x3, cranial nerves  3-12 appear to be intact, no obvious focal weakness,gait within normal limits no abnormal reflexes or asymmetrical SKIN: No acute rashes normal turgor, color, no bruising or petechiae. Healed scar left elbow and  ruabd PSYCH: Oriented, good eye contact, no obvious depression anxiety, cognition and judgment appear normal. LN: no cervical axillary inguinal adenopathy  Lab Results  Component Value Date   WBC 10.1 06/23/2013   HGB 16.1 06/23/2013   HCT 48.2 06/23/2013   PLT 222.0 06/23/2013   GLUCOSE 92 06/23/2013   CHOL 178 07/25/2012   TRIG 114.0 07/25/2012   HDL 32.80* 07/25/2012   LDLCALC 122* 07/25/2012   ALT 22 07/25/2012   AST 25 07/25/2012   NA 141 06/23/2013   K 3.7 06/23/2013   CL 104 06/23/2013   CREATININE 1.2 06/23/2013   BUN 9 06/23/2013   CO2 28 06/23/2013   TSH 1.82 07/25/2012    ASSESSMENT AND PLAN:  Discussed the following assessment and plan:  Visit for preventive health examination - utd counseled healthy ls - Plan: Lipid panel  Low HDL (under 40) - fasting lipid panel today counseled  - Plan: Lipid panel  Patient Care Team: Burnis Medin, MD as PCP - General (Internal Medicine) Patient Instructions  Continue lifestyle intervention healthy eating and exercise . Will notify you  of labs when available. Low hdl  Exercise and avoid transfats and processed foods  Preventive Care for Adults A healthy lifestyle and preventive care can promote health and wellness. Preventive health guidelines for men include the following key practices:  A routine yearly physical is a good way to check with your health care provider about your health and preventative screening. It is a chance to share any concerns and updates on your health and to receive a thorough exam.  Visit your dentist for a routine exam and preventative care every 6 months. Brush your teeth twice a day and floss once a day. Good oral hygiene prevents tooth decay and gum disease.  The frequency of eye exams is based on your  age, health, family medical history, use of contact lenses, and other factors. Follow your health care provider's recommendations for frequency of eye exams.  Eat a healthy diet. Foods such as vegetables, fruits, whole grains, low-fat dairy products, and lean protein foods contain the nutrients you need without too many calories. Decrease your intake of foods high in solid fats, added sugars, and salt. Eat the right amount of calories for you.Get information about a proper diet from your health care provider, if necessary.  Regular physical exercise is one of the most important things you can do for your health. Most adults should get at least 150 minutes of moderate-intensity exercise (any activity that increases your heart rate and causes you to sweat) each  week. In addition, most adults need muscle-strengthening exercises on 2 or more days a week.  Maintain a healthy weight. The body mass index (BMI) is a screening tool to identify possible weight problems. It provides an estimate of body fat based on height and weight. Your health care provider can find your BMI and can help you achieve or maintain a healthy weight.For adults 20 years and older:  A BMI below 18.5 is considered underweight.  A BMI of 18.5 to 24.9 is normal.  A BMI of 25 to 29.9 is considered overweight.  A BMI of 30 and above is considered obese.  Maintain normal blood lipids and cholesterol levels by exercising and minimizing your intake of saturated fat. Eat a balanced diet with plenty of fruit and vegetables. Blood tests for lipids and cholesterol should begin at age 39 and be repeated every 5 years. If your lipid or cholesterol levels are high, you are over 50, or you are at high risk for heart disease, you may need your cholesterol levels checked more frequently.Ongoing high lipid and cholesterol levels should be treated with medicines if diet and exercise are not working.  If you smoke, find out from your health care  provider how to quit. If you do not use tobacco, do not start.  Lung cancer screening is recommended for adults aged 92-80 years who are at high risk for developing lung cancer because of a history of smoking. A yearly low-dose CT scan of the lungs is recommended for people who have at least a 30-pack-year history of smoking and are a current smoker or have quit within the past 15 years. A pack year of smoking is smoking an average of 1 pack of cigarettes a day for 1 year (for example: 1 pack a day for 30 years or 2 packs a day for 15 years). Yearly screening should continue until the smoker has stopped smoking for at least 15 years. Yearly screening should be stopped for people who develop a health problem that would prevent them from having lung cancer treatment.  If you choose to drink alcohol, do not have more than 2 drinks per day. One drink is considered to be 12 ounces (355 mL) of beer, 5 ounces (148 mL) of wine, or 1.5 ounces (44 mL) of liquor.  Avoid use of street drugs. Do not share needles with anyone. Ask for help if you need support or instructions about stopping the use of drugs.  High blood pressure causes heart disease and increases the risk of stroke. Your blood pressure should be checked at least every 1-2 years. Ongoing high blood pressure should be treated with medicines, if weight loss and exercise are not effective.  If you are 70-91 years old, ask your health care provider if you should take aspirin to prevent heart disease.  Diabetes screening involves taking a blood sample to check your fasting blood sugar level. This should be done once every 3 years, after age 25, if you are within normal weight and without risk factors for diabetes. Testing should be considered at a younger age or be carried out more frequently if you are overweight and have at least 1 risk factor for diabetes.  Colorectal cancer can be detected and often prevented. Most routine colorectal cancer screening  begins at the age of 63 and continues through age 22. However, your health care provider may recommend screening at an earlier age if you have risk factors for colon cancer. On a yearly basis, your health  care provider may provide home test kits to check for hidden blood in the stool. Use of a small camera at the end of a tube to directly examine the colon (sigmoidoscopy or colonoscopy) can detect the earliest forms of colorectal cancer. Talk to your health care provider about this at age 66, when routine screening begins. Direct exam of the colon should be repeated every 5-10 years through age 21, unless early forms of precancerous polyps or small growths are found.  People who are at an increased risk for hepatitis B should be screened for this virus. You are considered at high risk for hepatitis B if:  You were born in a country where hepatitis B occurs often. Talk with your health care provider about which countries are considered high risk.  Your parents were born in a high-risk country and you have not received a shot to protect against hepatitis B (hepatitis B vaccine).  You have HIV or AIDS.  You use needles to inject street drugs.  You live with, or have sex with, someone who has hepatitis B.  You are a man who has sex with other men (MSM).  You get hemodialysis treatment.  You take certain medicines for conditions such as cancer, organ transplantation, and autoimmune conditions.  Hepatitis C blood testing is recommended for all people born from [birth year] through 1965 and any individual with known risks for hepatitis C.  Practice safe sex. Use condoms and avoid high-risk sexual practices to reduce the spread of sexually transmitted infections (STIs). STIs include gonorrhea, chlamydia, syphilis, trichomonas, herpes, HPV, and human immunodeficiency virus (HIV). Herpes, HIV, and HPV are viral illnesses that have no cure. They can result in disability, cancer, and death.  If you are at risk  of being infected with HIV, it is recommended that you take a prescription medicine daily to prevent HIV infection. This is called preexposure prophylaxis (PrEP). You are considered at risk if:  You are a man who has sex with other men (MSM) and have other risk factors.  You are a heterosexual man, are sexually active, and are at increased risk for HIV infection.  You take drugs by injection.  You are sexually active with a partner who has HIV.  Talk with your health care provider about whether you are at high risk of being infected with HIV. If you choose to begin PrEP, you should first be tested for HIV. You should then be tested every 3 months for as long as you are taking PrEP.  A one-time screening for abdominal aortic aneurysm (AAA) and surgical repair of large AAAs by ultrasound are recommended for men ages 81 to 10 years who are current or former smokers.  Healthy men should no longer receive prostate-specific antigen (PSA) blood tests as part of routine cancer screening. Talk with your health care provider about prostate cancer screening.  Testicular cancer screening is not recommended for adult males who have no symptoms. Screening includes self-exam, a health care provider exam, and other screening tests. Consult with your health care provider about any symptoms you have or any concerns you have about testicular cancer.  Use sunscreen. Apply sunscreen liberally and repeatedly throughout the day. You should seek shade when your shadow is shorter than you. Protect yourself by wearing long sleeves, pants, a wide-brimmed hat, and sunglasses year round, whenever you are outdoors.  Once a month, do a whole-body skin exam, using a mirror to look at the skin on your back. Tell your health care provider  about new moles, moles that have irregular borders, moles that are larger than a pencil eraser, or moles that have changed in shape or color.  Stay current with required vaccines  (immunizations).  Influenza vaccine. All adults should be immunized every year.  Tetanus, diphtheria, and acellular pertussis (Td, Tdap) vaccine. An adult who has not previously received Tdap or who does not know his vaccine status should receive 1 dose of Tdap. This initial dose should be followed by tetanus and diphtheria toxoids (Td) booster doses every 10 years. Adults with an unknown or incomplete history of completing a 3-dose immunization series with Td-containing vaccines should begin or complete a primary immunization series including a Tdap dose. Adults should receive a Td booster every 10 years.  Varicella vaccine. An adult without evidence of immunity to varicella should receive 2 doses or a second dose if he has previously received 1 dose.  Human papillomavirus (HPV) vaccine. Males aged 23-21 years who have not received the vaccine previously should receive the 3-dose series. Males aged 22-26 years may be immunized. Immunization is recommended through the age of 66 years for any male who has sex with males and did not get any or all doses earlier. Immunization is recommended for any person with an immunocompromised condition through the age of 64 years if he did not get any or all doses earlier. During the 3-dose series, the second dose should be obtained 4-8 weeks after the first dose. The third dose should be obtained 24 weeks after the first dose and 16 weeks after the second dose.  Zoster vaccine. One dose is recommended for adults aged 76 years or older unless certain conditions are present.  Measles, mumps, and rubella (MMR) vaccine. Adults born before [birth year] generally are considered immune to measles and mumps. Adults born in [birth year] or later should have 1 or more doses of MMR vaccine unless there is a contraindication to the vaccine or there is laboratory evidence of immunity to each of the three diseases. A routine second dose of MMR vaccine should be obtained at least 28 days after the  first dose for students attending postsecondary schools, health care workers, or international travelers. People who received inactivated measles vaccine or an unknown type of measles vaccine during 1963-1967 should receive 2 doses of MMR vaccine. People who received inactivated mumps vaccine or an unknown type of mumps vaccine before 1979 and are at high risk for mumps infection should consider immunization with 2 doses of MMR vaccine. Unvaccinated health care workers born before [birth year] who lack laboratory evidence of measles, mumps, or rubella immunity or laboratory confirmation of disease should consider measles and mumps immunization with 2 doses of MMR vaccine or rubella immunization with 1 dose of MMR vaccine.  Pneumococcal 13-valent conjugate (PCV13) vaccine. When indicated, a person who is uncertain of his immunization history and has no record of immunization should receive the PCV13 vaccine. An adult aged 81 years or older who has certain medical conditions and has not been previously immunized should receive 1 dose of PCV13 vaccine. This PCV13 should be followed with a dose of pneumococcal polysaccharide (PPSV23) vaccine. The PPSV23 vaccine dose should be obtained at least 8 weeks after the dose of PCV13 vaccine. An adult aged 62 years or older who has certain medical conditions and previously received 1 or more doses of PPSV23 vaccine should receive 1 dose of PCV13. The PCV13 vaccine dose should be obtained 1 or more years after the last PPSV23 vaccine dose.  Pneumococcal  polysaccharide (PPSV23) vaccine. When PCV13 is also indicated, PCV13 should be obtained first. All adults aged 19 years and older should be immunized. An adult younger than age 93 years who has certain medical conditions should be immunized. Any person who resides in a nursing home or long-term care facility should be immunized. An adult smoker should be immunized. People with an immunocompromised condition and certain other  conditions should receive both PCV13 and PPSV23 vaccines. People with human immunodeficiency virus (HIV) infection should be immunized as soon as possible after diagnosis. Immunization during chemotherapy or radiation therapy should be avoided. Routine use of PPSV23 vaccine is not recommended for American Indians, [place] Natives, or people younger than 65 years unless there are medical conditions that require PPSV23 vaccine. When indicated, people who have unknown immunization and have no record of immunization should receive PPSV23 vaccine. One-time revaccination 5 years after the first dose of PPSV23 is recommended for people aged 19-64 years who have chronic kidney failure, nephrotic syndrome, asplenia, or immunocompromised conditions. People who received 1-2 doses of PPSV23 before age 33 years should receive another dose of PPSV23 vaccine at age 73 years or later if at least 5 years have passed since the previous dose. Doses of PPSV23 are not needed for people immunized with PPSV23 at or after age 8 years.  Meningococcal vaccine. Adults with asplenia or persistent complement component deficiencies should receive 2 doses of quadrivalent meningococcal conjugate (MenACWY-D) vaccine. The doses should be obtained at least 2 months apart. Microbiologists working with certain meningococcal bacteria, [place] recruits, people at risk during an outbreak, and people who travel to or live in countries with a high rate of meningitis should be immunized. A first-year college student up through age 64 years who is living in a residence hall should receive a dose if he did not receive a dose on or after his 16th birthday. Adults who have certain high-risk conditions should receive one or more doses of vaccine.  Hepatitis A vaccine. Adults who wish to be protected from this disease, have certain high-risk conditions, work with hepatitis A-infected animals, work in hepatitis A research labs, or travel to or work in  countries with a high rate of hepatitis A should be immunized. Adults who were previously unvaccinated and who anticipate close contact with an international adoptee during the first 60 days after arrival in the [place] from a country with a high rate of hepatitis A should be immunized.  Hepatitis B vaccine. Adults should be immunized if they wish to be protected from this disease, have certain high-risk conditions, may be exposed to blood or other infectious body fluids, are household contacts or sex partners of hepatitis B positive people, are clients or workers in certain care facilities, or travel to or work in countries with a high rate of hepatitis B.  Haemophilus influenzae type b (Hib) vaccine. A previously unvaccinated person with asplenia or sickle cell disease or having a scheduled splenectomy should receive 1 dose of Hib vaccine. Regardless of previous immunization, a recipient of a hematopoietic stem cell transplant should receive a 3-dose series 6-12 months after his successful transplant. Hib vaccine is not recommended for adults with HIV infection. Preventive Service / Frequency Ages 73 to 22  Blood pressure check.** / Every 1 to 2 years.  Lipid and cholesterol check.** / Every 5 years beginning at age 6.  Hepatitis C blood test.** / For any individual with known risks for hepatitis C.  Skin self-exam. / Monthly.  Influenza vaccine. /  Every year.  Tetanus, diphtheria, and acellular pertussis (Tdap, Td) vaccine.** / Consult your health care provider. 1 dose of Td every 10 years.  Varicella vaccine.** / Consult your health care provider.  HPV vaccine. / 3 doses over 6 months, if 44 or younger.  Measles, mumps, rubella (MMR) vaccine.** / You need at least 1 dose of MMR if you were born in 1957 or later. You may also need a second dose.  Pneumococcal 13-valent conjugate (PCV13) vaccine.** / Consult your health care provider.  Pneumococcal polysaccharide (PPSV23)  vaccine.** / 1 to 2 doses if you smoke cigarettes or if you have certain conditions.  Meningococcal vaccine.** / 1 dose if you are age 53 to 13 years and a Market researcher living in a residence hall, or have one of several medical conditions. You may also need additional booster doses.  Hepatitis A vaccine.** / Consult your health care provider.  Hepatitis B vaccine.** / Consult your health care provider.  Haemophilus influenzae type b (Hib) vaccine.** / Consult your health care provider. Ages 26 to 63  Blood pressure check.** / Every 1 to 2 years.  Lipid and cholesterol check.** / Every 5 years beginning at age 56.  Lung cancer screening. / Every year if you are aged 45-80 years and have a 30-pack-year history of smoking and currently smoke or have quit within the past 15 years. Yearly screening is stopped once you have quit smoking for at least 15 years or develop a health problem that would prevent you from having lung cancer treatment.  Fecal occult blood test (FOBT) of stool. / Every year beginning at age 71 and continuing until age 35. You may not have to do this test if you get a colonoscopy every 10 years.  Flexible sigmoidoscopy** or colonoscopy.** / Every 5 years for a flexible sigmoidoscopy or every 10 years for a colonoscopy beginning at age 77 and continuing until age 47.  Hepatitis C blood test.** / For all people born from [birth year] through 1965 and any individual with known risks for hepatitis C.  Skin self-exam. / Monthly.  Influenza vaccine. / Every year.  Tetanus, diphtheria, and acellular pertussis (Tdap/Td) vaccine.** / Consult your health care provider. 1 dose of Td every 10 years.  Varicella vaccine.** / Consult your health care provider.  Zoster vaccine.** / 1 dose for adults aged 44 years or older.  Measles, mumps, rubella (MMR) vaccine.** / You need at least 1 dose of MMR if you were born in 1957 or later. You may also need a second  dose.  Pneumococcal 13-valent conjugate (PCV13) vaccine.** / Consult your health care provider.  Pneumococcal polysaccharide (PPSV23) vaccine.** / 1 to 2 doses if you smoke cigarettes or if you have certain conditions.  Meningococcal vaccine.** / Consult your health care provider.  Hepatitis A vaccine.** / Consult your health care provider.  Hepatitis B vaccine.** / Consult your health care provider.  Haemophilus influenzae type b (Hib) vaccine.** / Consult your health care provider. Ages 94 and over  Blood pressure check.** / Every 1 to 2 years.  Lipid and cholesterol check.**/ Every 5 years beginning at age 76.  Lung cancer screening. / Every year if you are aged 56-80 years and have a 30-pack-year history of smoking and currently smoke or have quit within the past 15 years. Yearly screening is stopped once you have quit smoking for at least 15 years or develop a health problem that would prevent you from having lung cancer treatment.  Fecal  occult blood test (FOBT) of stool. / Every year beginning at age 30 and continuing until age 60. You may not have to do this test if you get a colonoscopy every 10 years.  Flexible sigmoidoscopy** or colonoscopy.** / Every 5 years for a flexible sigmoidoscopy or every 10 years for a colonoscopy beginning at age 55 and continuing until age 42.  Hepatitis C blood test.** / For all people born from [birth year] through 1964 and any individual with known risks for hepatitis C.  Abdominal aortic aneurysm (AAA) screening.** / A one-time screening for ages 2 to 27 years who are current or former smokers.  Skin self-exam. / Monthly.  Influenza vaccine. / Every year.  Tetanus, diphtheria, and acellular pertussis (Tdap/Td) vaccine.** / 1 dose of Td every 10 years.  Varicella vaccine.** / Consult your health care provider.  Zoster vaccine.** / 1 dose for adults aged 21 years or older.  Pneumococcal 13-valent conjugate (PCV13) vaccine.** / Consult your  health care provider.  Pneumococcal polysaccharide (PPSV23) vaccine.** / 1 dose for all adults aged 49 years and older.  Meningococcal vaccine.** / Consult your health care provider.  Hepatitis A vaccine.** / Consult your health care provider.  Hepatitis B vaccine.** / Consult your health care provider.  Haemophilus influenzae type b (Hib) vaccine.** / Consult your health care provider. **Family history and personal history of risk and conditions may change your health care provider's recommendations. Document Released: 05/08/2001 Document Revised: 03/17/2013 Document Reviewed: 08/07/2010 [facility] Patient Information 2015 [place], [place]. This information is not intended to replace advice given to you by your health care provider. Make sure you discuss any questions you have with your health care provider.     Standley Brooking. Panosh M.D.    Pre visit review using our clinic review tool, if applicable. No additional management support is needed unless otherwise documented below in the visit note.

## 2013-09-15 NOTE — Patient Instructions (Signed)
Continue lifestyle intervention healthy eating and exercise . Will notify you  of labs when available. Low hdl  Exercise and avoid transfats and processed foods  Preventive Care for Adults A healthy lifestyle and preventive care can promote health and wellness. Preventive health guidelines for men include the following key practices:  A routine yearly physical is a good way to check with your health care provider about your health and preventative screening. It is a chance to share any concerns and updates on your health and to receive a thorough exam.  Visit your dentist for a routine exam and preventative care every 6 months. Brush your teeth twice a day and floss once a day. Good oral hygiene prevents tooth decay and gum disease.  The frequency of eye exams is based on your age, health, family medical history, use of contact lenses, and other factors. Follow your health care provider's recommendations for frequency of eye exams.  Eat a healthy diet. Foods such as vegetables, fruits, whole grains, low-fat dairy products, and lean protein foods contain the nutrients you need without too many calories. Decrease your intake of foods high in solid fats, added sugars, and salt. Eat the right amount of calories for you.Get information about a proper diet from your health care provider, if necessary.  Regular physical exercise is one of the most important things you can do for your health. Most adults should get at least 150 minutes of moderate-intensity exercise (any activity that increases your heart rate and causes you to sweat) each week. In addition, most adults need muscle-strengthening exercises on 2 or more days a week.  Maintain a healthy weight. The body mass index (BMI) is a screening tool to identify possible weight problems. It provides an estimate of body fat based on height and weight. Your health care provider can find your BMI and can help you achieve or maintain a healthy weight.For  adults 20 years and older:  A BMI below 18.5 is considered underweight.  A BMI of 18.5 to 24.9 is normal.  A BMI of 25 to 29.9 is considered overweight.  A BMI of 30 and above is considered obese.  Maintain normal blood lipids and cholesterol levels by exercising and minimizing your intake of saturated fat. Eat a balanced diet with plenty of fruit and vegetables. Blood tests for lipids and cholesterol should begin at age 61 and be repeated every 5 years. If your lipid or cholesterol levels are high, you are over 50, or you are at high risk for heart disease, you may need your cholesterol levels checked more frequently.Ongoing high lipid and cholesterol levels should be treated with medicines if diet and exercise are not working.  If you smoke, find out from your health care provider how to quit. If you do not use tobacco, do not start.  Lung cancer screening is recommended for adults aged 2-80 years who are at high risk for developing lung cancer because of a history of smoking. A yearly low-dose CT scan of the lungs is recommended for people who have at least a 30-pack-year history of smoking and are a current smoker or have quit within the past 15 years. A pack year of smoking is smoking an average of 1 pack of cigarettes a day for 1 year (for example: 1 pack a day for 30 years or 2 packs a day for 15 years). Yearly screening should continue until the smoker has stopped smoking for at least 15 years. Yearly screening should be stopped for  people who develop a health problem that would prevent them from having lung cancer treatment.  If you choose to drink alcohol, do not have more than 2 drinks per day. One drink is considered to be 12 ounces (355 mL) of beer, 5 ounces (148 mL) of wine, or 1.5 ounces (44 mL) of liquor.  Avoid use of street drugs. Do not share needles with anyone. Ask for help if you need support or instructions about stopping the use of drugs.  High blood pressure causes  heart disease and increases the risk of stroke. Your blood pressure should be checked at least every 1-2 years. Ongoing high blood pressure should be treated with medicines, if weight loss and exercise are not effective.  If you are 38-109 years old, ask your health care provider if you should take aspirin to prevent heart disease.  Diabetes screening involves taking a blood sample to check your fasting blood sugar level. This should be done once every 3 years, after age 36, if you are within normal weight and without risk factors for diabetes. Testing should be considered at a younger age or be carried out more frequently if you are overweight and have at least 1 risk factor for diabetes.  Colorectal cancer can be detected and often prevented. Most routine colorectal cancer screening begins at the age of 40 and continues through age 64. However, your health care provider may recommend screening at an earlier age if you have risk factors for colon cancer. On a yearly basis, your health care provider may provide home test kits to check for hidden blood in the stool. Use of a small camera at the end of a tube to directly examine the colon (sigmoidoscopy or colonoscopy) can detect the earliest forms of colorectal cancer. Talk to your health care provider about this at age 48, when routine screening begins. Direct exam of the colon should be repeated every 5-10 years through age 48, unless early forms of precancerous polyps or small growths are found.  People who are at an increased risk for hepatitis B should be screened for this virus. You are considered at high risk for hepatitis B if:  You were born in a country where hepatitis B occurs often. Talk with your health care provider about which countries are considered high risk.  Your parents were born in a high-risk country and you have not received a shot to protect against hepatitis B (hepatitis B vaccine).  You have HIV or AIDS.  You use needles to  inject street drugs.  You live with, or have sex with, someone who has hepatitis B.  You are a man who has sex with other men (MSM).  You get hemodialysis treatment.  You take certain medicines for conditions such as cancer, organ transplantation, and autoimmune conditions.  Hepatitis C blood testing is recommended for all people born from [birth year] through 1965 and any individual with known risks for hepatitis C.  Practice safe sex. Use condoms and avoid high-risk sexual practices to reduce the spread of sexually transmitted infections (STIs). STIs include gonorrhea, chlamydia, syphilis, trichomonas, herpes, HPV, and human immunodeficiency virus (HIV). Herpes, HIV, and HPV are viral illnesses that have no cure. They can result in disability, cancer, and death.  If you are at risk of being infected with HIV, it is recommended that you take a prescription medicine daily to prevent HIV infection. This is called preexposure prophylaxis (PrEP). You are considered at risk if:  You are a man who has  sex with other men (MSM) and have other risk factors.  You are a heterosexual man, are sexually active, and are at increased risk for HIV infection.  You take drugs by injection.  You are sexually active with a partner who has HIV.  Talk with your health care provider about whether you are at high risk of being infected with HIV. If you choose to begin PrEP, you should first be tested for HIV. You should then be tested every 3 months for as long as you are taking PrEP.  A one-time screening for abdominal aortic aneurysm (AAA) and surgical repair of large AAAs by ultrasound are recommended for men ages 69 to 45 years who are current or former smokers.  Healthy men should no longer receive prostate-specific antigen (PSA) blood tests as part of routine cancer screening. Talk with your health care provider about prostate cancer screening.  Testicular cancer screening is not recommended for adult males who  have no symptoms. Screening includes self-exam, a health care provider exam, and other screening tests. Consult with your health care provider about any symptoms you have or any concerns you have about testicular cancer.  Use sunscreen. Apply sunscreen liberally and repeatedly throughout the day. You should seek shade when your shadow is shorter than you. Protect yourself by wearing long sleeves, pants, a wide-brimmed hat, and sunglasses year round, whenever you are outdoors.  Once a month, do a whole-body skin exam, using a mirror to look at the skin on your back. Tell your health care provider about new moles, moles that have irregular borders, moles that are larger than a pencil eraser, or moles that have changed in shape or color.  Stay current with required vaccines (immunizations).  Influenza vaccine. All adults should be immunized every year.  Tetanus, diphtheria, and acellular pertussis (Td, Tdap) vaccine. An adult who has not previously received Tdap or who does not know his vaccine status should receive 1 dose of Tdap. This initial dose should be followed by tetanus and diphtheria toxoids (Td) booster doses every 10 years. Adults with an unknown or incomplete history of completing a 3-dose immunization series with Td-containing vaccines should begin or complete a primary immunization series including a Tdap dose. Adults should receive a Td booster every 10 years.  Varicella vaccine. An adult without evidence of immunity to varicella should receive 2 doses or a second dose if he has previously received 1 dose.  Human papillomavirus (HPV) vaccine. Males aged 23-21 years who have not received the vaccine previously should receive the 3-dose series. Males aged 22-26 years may be immunized. Immunization is recommended through the age of 38 years for any male who has sex with males and did not get any or all doses earlier. Immunization is recommended for any person with an immunocompromised  condition through the age of 70 years if he did not get any or all doses earlier. During the 3-dose series, the second dose should be obtained 4-8 weeks after the first dose. The third dose should be obtained 24 weeks after the first dose and 16 weeks after the second dose.  Zoster vaccine. One dose is recommended for adults aged 15 years or older unless certain conditions are present.  Measles, mumps, and rubella (MMR) vaccine. Adults born before [birth year] generally are considered immune to measles and mumps. Adults born in [birth year] or later should have 1 or more doses of MMR vaccine unless there is a contraindication to the vaccine or there is laboratory evidence of immunity  to each of the three diseases. A routine second dose of MMR vaccine should be obtained at least 28 days after the first dose for students attending postsecondary schools, health care workers, or international travelers. People who received inactivated measles vaccine or an unknown type of measles vaccine during 1963-1967 should receive 2 doses of MMR vaccine. People who received inactivated mumps vaccine or an unknown type of mumps vaccine before 1979 and are at high risk for mumps infection should consider immunization with 2 doses of MMR vaccine. Unvaccinated health care workers born before [birth year] who lack laboratory evidence of measles, mumps, or rubella immunity or laboratory confirmation of disease should consider measles and mumps immunization with 2 doses of MMR vaccine or rubella immunization with 1 dose of MMR vaccine.  Pneumococcal 13-valent conjugate (PCV13) vaccine. When indicated, a person who is uncertain of his immunization history and has no record of immunization should receive the PCV13 vaccine. An adult aged 59 years or older who has certain medical conditions and has not been previously immunized should receive 1 dose of PCV13 vaccine. This PCV13 should be followed with a dose of pneumococcal polysaccharide (PPSV23) vaccine. The  PPSV23 vaccine dose should be obtained at least 8 weeks after the dose of PCV13 vaccine. An adult aged 68 years or older who has certain medical conditions and previously received 1 or more doses of PPSV23 vaccine should receive 1 dose of PCV13. The PCV13 vaccine dose should be obtained 1 or more years after the last PPSV23 vaccine dose.  Pneumococcal polysaccharide (PPSV23) vaccine. When PCV13 is also indicated, PCV13 should be obtained first. All adults aged 36 years and older should be immunized. An adult younger than age 72 years who has certain medical conditions should be immunized. Any person who resides in a nursing home or long-term care facility should be immunized. An adult smoker should be immunized. People with an immunocompromised condition and certain other conditions should receive both PCV13 and PPSV23 vaccines. People with human immunodeficiency virus (HIV) infection should be immunized as soon as possible after diagnosis. Immunization during chemotherapy or radiation therapy should be avoided. Routine use of PPSV23 vaccine is not recommended for American Indians, [place] Natives, or people younger than 65 years unless there are medical conditions that require PPSV23 vaccine. When indicated, people who have unknown immunization and have no record of immunization should receive PPSV23 vaccine. One-time revaccination 5 years after the first dose of PPSV23 is recommended for people aged 19-64 years who have chronic kidney failure, nephrotic syndrome, asplenia, or immunocompromised conditions. People who received 1-2 doses of PPSV23 before age 72 years should receive another dose of PPSV23 vaccine at age 47 years or later if at least 5 years have passed since the previous dose. Doses of PPSV23 are not needed for people immunized with PPSV23 at or after age 38 years.  Meningococcal vaccine. Adults with asplenia or persistent complement component deficiencies should receive 2 doses of quadrivalent  meningococcal conjugate (MenACWY-D) vaccine. The doses should be obtained at least 2 months apart. Microbiologists working with certain meningococcal bacteria, [place] recruits, people at risk during an outbreak, and people who travel to or live in countries with a high rate of meningitis should be immunized. A first-year college student up through age 58 years who is living in a residence hall should receive a dose if he did not receive a dose on or after his 16th birthday. Adults who have certain high-risk conditions should receive one or more doses of vaccine.  Hepatitis A vaccine. Adults who wish to be protected from this disease, have certain high-risk conditions, work with hepatitis A-infected animals, work in hepatitis A research labs, or travel to or work in countries with a high rate of hepatitis A should be immunized. Adults who were previously unvaccinated and who anticipate close contact with an international adoptee during the first 60 days after arrival in the [place] from a country with a high rate of hepatitis A should be immunized.  Hepatitis B vaccine. Adults should be immunized if they wish to be protected from this disease, have certain high-risk conditions, may be exposed to blood or other infectious body fluids, are household contacts or sex partners of hepatitis B positive people, are clients or workers in certain care facilities, or travel to or work in countries with a high rate of hepatitis B.  Haemophilus influenzae type b (Hib) vaccine. A previously unvaccinated person with asplenia or sickle cell disease or having a scheduled splenectomy should receive 1 dose of Hib vaccine. Regardless of previous immunization, a recipient of a hematopoietic stem cell transplant should receive a 3-dose series 6-12 months after his successful transplant. Hib vaccine is not recommended for adults with HIV infection. Preventive Service / Frequency Ages 44 to 48  Blood pressure check.** /  Every 1 to 2 years.  Lipid and cholesterol check.** / Every 5 years beginning at age 60.  Hepatitis C blood test.** / For any individual with known risks for hepatitis C.  Skin self-exam. / Monthly.  Influenza vaccine. / Every year.  Tetanus, diphtheria, and acellular pertussis (Tdap, Td) vaccine.** / Consult your health care provider. 1 dose of Td every 10 years.  Varicella vaccine.** / Consult your health care provider.  HPV vaccine. / 3 doses over 6 months, if 3 or younger.  Measles, mumps, rubella (MMR) vaccine.** / You need at least 1 dose of MMR if you were born in 1957 or later. You may also need a second dose.  Pneumococcal 13-valent conjugate (PCV13) vaccine.** / Consult your health care provider.  Pneumococcal polysaccharide (PPSV23) vaccine.** / 1 to 2 doses if you smoke cigarettes or if you have certain conditions.  Meningococcal vaccine.** / 1 dose if you are age 19 to 33 years and a Market researcher living in a residence hall, or have one of several medical conditions. You may also need additional booster doses.  Hepatitis A vaccine.** / Consult your health care provider.  Hepatitis B vaccine.** / Consult your health care provider.  Haemophilus influenzae type b (Hib) vaccine.** / Consult your health care provider. Ages 66 to 23  Blood pressure check.** / Every 1 to 2 years.  Lipid and cholesterol check.** / Every 5 years beginning at age 59.  Lung cancer screening. / Every year if you are aged 43-80 years and have a 30-pack-year history of smoking and currently smoke or have quit within the past 15 years. Yearly screening is stopped once you have quit smoking for at least 15 years or develop a health problem that would prevent you from having lung cancer treatment.  Fecal occult blood test (FOBT) of stool. / Every year beginning at age 28 and continuing until age 58. You may not have to do this test if you get a colonoscopy every 10 years.  Flexible  sigmoidoscopy** or colonoscopy.** / Every 5 years for a flexible sigmoidoscopy or every 10 years for a colonoscopy beginning at age 66 and continuing until age 40.  Hepatitis C blood test.** /  For all people born from [birth year] through 1964 and any individual with known risks for hepatitis C.  Skin self-exam. / Monthly.  Influenza vaccine. / Every year.  Tetanus, diphtheria, and acellular pertussis (Tdap/Td) vaccine.** / Consult your health care provider. 1 dose of Td every 10 years.  Varicella vaccine.** / Consult your health care provider.  Zoster vaccine.** / 1 dose for adults aged 56 years or older.  Measles, mumps, rubella (MMR) vaccine.** / You need at least 1 dose of MMR if you were born in 1956 or later. You may also need a second dose.  Pneumococcal 13-valent conjugate (PCV13) vaccine.** / Consult your health care provider.  Pneumococcal polysaccharide (PPSV23) vaccine.** / 1 to 2 doses if you smoke cigarettes or if you have certain conditions.  Meningococcal vaccine.** / Consult your health care provider.  Hepatitis A vaccine.** / Consult your health care provider.  Hepatitis B vaccine.** / Consult your health care provider.  Haemophilus influenzae type b (Hib) vaccine.** / Consult your health care provider. Ages 63 and over  Blood pressure check.** / Every 1 to 2 years.  Lipid and cholesterol check.**/ Every 5 years beginning at age 26.  Lung cancer screening. / Every year if you are aged 50-80 years and have a 30-pack-year history of smoking and currently smoke or have quit within the past 15 years. Yearly screening is stopped once you have quit smoking for at least 15 years or develop a health problem that would prevent you from having lung cancer treatment.  Fecal occult blood test (FOBT) of stool. / Every year beginning at age 52 and continuing until age 76. You may not have to do this test if you get a colonoscopy every 10 years.  Flexible sigmoidoscopy** or  colonoscopy.** / Every 5 years for a flexible sigmoidoscopy or every 10 years for a colonoscopy beginning at age 95 and continuing until age 44.  Hepatitis C blood test.** / For all people born from [birth year] through 1964 and any individual with known risks for hepatitis C.  Abdominal aortic aneurysm (AAA) screening.** / A one-time screening for ages 2 to 57 years who are current or former smokers.  Skin self-exam. / Monthly.  Influenza vaccine. / Every year.  Tetanus, diphtheria, and acellular pertussis (Tdap/Td) vaccine.** / 1 dose of Td every 10 years.  Varicella vaccine.** / Consult your health care provider.  Zoster vaccine.** / 1 dose for adults aged 77 years or older.  Pneumococcal 13-valent conjugate (PCV13) vaccine.** / Consult your health care provider.  Pneumococcal polysaccharide (PPSV23) vaccine.** / 1 dose for all adults aged 20 years and older.  Meningococcal vaccine.** / Consult your health care provider.  Hepatitis A vaccine.** / Consult your health care provider.  Hepatitis B vaccine.** / Consult your health care provider.  Haemophilus influenzae type b (Hib) vaccine.** / Consult your health care provider. **Family history and personal history of risk and conditions may change your health care provider's recommendations. Document Released: 05/08/2001 Document Revised: 03/17/2013 Document Reviewed: 08/07/2010 [facility] Patient Information 2015 [place], [place]. This information is not intended to replace advice given to you by your health care provider. Make sure you discuss any questions you have with your health care provider.

## 2015-05-31 ENCOUNTER — Other Ambulatory Visit (INDEPENDENT_AMBULATORY_CARE_PROVIDER_SITE_OTHER): Payer: 59

## 2015-05-31 DIAGNOSIS — Z0000 Encounter for general adult medical examination without abnormal findings: Secondary | ICD-10-CM | POA: Diagnosis not present

## 2015-05-31 LAB — BASIC METABOLIC PANEL
BUN: 17 mg/dL (ref 6–23)
CHLORIDE: 105 meq/L (ref 96–112)
CO2: 27 meq/L (ref 19–32)
Calcium: 9.7 mg/dL (ref 8.4–10.5)
Creatinine, Ser: 1.17 mg/dL (ref 0.40–1.50)
GFR: 72.02 mL/min (ref >60.00)
GLUCOSE: 96 mg/dL (ref 70–99)
POTASSIUM: 4.6 meq/L (ref 3.5–5.1)
SODIUM: 143 meq/L (ref 135–145)

## 2015-05-31 LAB — CBC WITH DIFFERENTIAL/PLATELET
Basophils Absolute: 0 10*3/uL (ref 0.0–0.1)
Basophils Relative: 0.6 % (ref 0.0–3.0)
EOS PCT: 4 % (ref 0.0–5.0)
Eosinophils Absolute: 0.3 10*3/uL (ref 0.0–0.7)
HCT: 50.7 % (ref 39.0–52.0)
Hemoglobin: 17.5 g/dL — ABNORMAL HIGH (ref 13.0–17.0)
LYMPHS ABS: 1.9 10*3/uL (ref 0.7–4.0)
Lymphocytes Relative: 26.7 % (ref 12.0–46.0)
MCHC: 34.5 g/dL (ref 30.0–36.0)
MCV: 86.9 fl (ref 78.0–100.0)
MONOS PCT: 5.7 % (ref 3.0–12.0)
Monocytes Absolute: 0.4 10*3/uL (ref 0.1–1.0)
NEUTROS ABS: 4.4 10*3/uL (ref 1.4–7.7)
NEUTROS PCT: 63 % (ref 43.0–77.0)
PLATELETS: 214 10*3/uL (ref 150.0–400.0)
RBC: 5.84 Mil/uL — AB (ref 4.22–5.81)
RDW: 12.8 % (ref 11.5–15.5)
WBC: 7 10*3/uL (ref 4.0–10.5)

## 2015-05-31 LAB — TSH: TSH: 2.16 u[IU]/mL (ref 0.35–4.50)

## 2015-05-31 LAB — LIPID PANEL
Cholesterol: 169 mg/dL (ref 0–200)
HDL: 35.8 mg/dL — AB (ref >39.00)
LDL Cholesterol: 102 mg/dL — ABNORMAL HIGH (ref 0–99)
NONHDL: 133.1
Total CHOL/HDL Ratio: 5
Triglycerides: 155 mg/dL — ABNORMAL HIGH (ref 0.0–149.0)
VLDL: 31 mg/dL (ref 0.0–40.0)

## 2015-05-31 LAB — HEPATIC FUNCTION PANEL
ALBUMIN: 4.7 g/dL (ref 3.5–5.2)
ALT: 30 U/L (ref 0–53)
AST: 23 U/L (ref 0–37)
Alkaline Phosphatase: 70 U/L (ref 39–117)
Bilirubin, Direct: 0.1 mg/dL (ref 0.0–0.3)
Total Bilirubin: 0.7 mg/dL (ref 0.2–1.2)
Total Protein: 7 g/dL (ref 6.0–8.3)

## 2015-06-07 ENCOUNTER — Encounter: Payer: Self-pay | Admitting: Internal Medicine

## 2015-06-13 ENCOUNTER — Ambulatory Visit (INDEPENDENT_AMBULATORY_CARE_PROVIDER_SITE_OTHER): Payer: 59 | Admitting: Internal Medicine

## 2015-06-13 ENCOUNTER — Encounter: Payer: Self-pay | Admitting: Internal Medicine

## 2015-06-13 VITALS — BP 116/76 | HR 83 | Temp 99.0°F | Wt 184.1 lb

## 2015-06-13 DIAGNOSIS — R509 Fever, unspecified: Secondary | ICD-10-CM

## 2015-06-13 DIAGNOSIS — R69 Illness, unspecified: Secondary | ICD-10-CM

## 2015-06-13 DIAGNOSIS — J111 Influenza due to unidentified influenza virus with other respiratory manifestations: Secondary | ICD-10-CM | POA: Diagnosis not present

## 2015-06-13 LAB — POCT INFLUENZA A: RAPID INFLUENZA A AGN: NEGATIVE

## 2015-06-13 MED ORDER — OSELTAMIVIR PHOSPHATE 75 MG PO CAPS: 75.0000 mg | ORAL_CAPSULE | Freq: Two times a day (BID) | ORAL | Status: DC

## 2015-06-13 NOTE — Patient Instructions (Signed)
This acts like influenza  That is common in the community right now  Now signs of pneumonia.  Influenza, Adult Influenza ("the flu") is a viral infection of the respiratory tract. It occurs more often in winter months because people spend more time in close contact with one another. Influenza can make you feel very sick. Influenza easily spreads from person to person (contagious). CAUSES  Influenza is caused by a virus that infects the respiratory tract. You can catch the virus by breathing in droplets from an infected person's cough or sneeze. You can also catch the virus by touching something that was recently contaminated with the virus and then touching your mouth, nose, or eyes. RISKS AND COMPLICATIONS You may be at risk for a more severe case of influenza if you smoke cigarettes, have diabetes, have chronic heart disease (such as heart failure) or lung disease (such as asthma), or if you have a weakened immune system. Elderly people and pregnant women are also at risk for more serious infections. The most common problem of influenza is a lung infection (pneumonia). Sometimes, this problem can require emergency medical care and may be life threatening. SIGNS AND SYMPTOMS  Symptoms typically last 4 to 10 days and may include:  Fever.  Chills.  Headache, body aches, and muscle aches.  Sore throat.  Chest discomfort and cough.  Poor appetite.  Weakness or feeling tired.  Dizziness.  Nausea or vomiting. DIAGNOSIS  Diagnosis of influenza is often made based on your history and a physical exam. A nose or throat swab test can be done to confirm the diagnosis. TREATMENT  In mild cases, influenza goes away on its own. Treatment is directed at relieving symptoms. For more severe cases, your health care provider may prescribe antiviral medicines to shorten the sickness. Antibiotic medicines are not effective because the infection is caused by a virus, not by bacteria. HOME CARE  INSTRUCTIONS  Take medicines only as directed by your health care provider.  Use a cool mist humidifier to make breathing easier.  Get plenty of rest until your temperature returns to normal. This usually takes 3 to 4 days.  Drink enough fluid to keep your urine clear or pale yellow.  Cover yourmouth and nosewhen coughing or sneezing,and wash your handswellto prevent thevirusfrom spreading.  Stay homefromwork orschool untilthe fever is gonefor at least 361full day. PREVENTION  An annual influenza vaccination (flu shot) is the best way to avoid getting influenza. An annual flu shot is now routinely recommended for all adults in the [place] SEEK MEDICAL CARE IF:  You experiencechest pain, yourcough worsens,or you producemore mucus.  Youhave nausea,vomiting, ordiarrhea.  Your fever returns or gets worse. SEEK IMMEDIATE MEDICAL CARE IF:  You havetrouble breathing, you become short of breath,or your skin ornails becomebluish.  You have severe painor stiffnessin the neck.  You develop a sudden headache, or pain in the face or ear.  You have nausea or vomiting that you cannot control. MAKE SURE YOU:   Understand these instructions.  Will watch your condition.  Will get help right away if you are not doing well or get worse.   This information is not intended to replace advice given to you by your health care provider. Make sure you discuss any questions you have with your health care provider.   Document Released: 03/09/2000 Document Revised: 04/02/2014 Document Reviewed: 06/11/2011 Elsevier Interactive Patient Education [employer]2016 Elsevier Inc.

## 2015-06-13 NOTE — Progress Notes (Signed)
Pre visit review using our clinic review tool, if applicable. No additional management support is needed unless otherwise documented below in the visit note.  Chief Complaint  Patient presents with  . Shortness of Breath  . Fever  . Generalized Body Aches  . Cough    HPI: Sergio Hampton 45 y.o. here sda  For acute problem   Son  Had   Positive flu  test a few weeks ago .  And brother had the same ax rx pred and z pack   At work last week.  Onset cough and  fever  Yesterday lunch time and felt weak. Then up to 101 +  This am  Took 2 advil then .     Came in  Achy midl cough  Sob tying shoes  No cp hemoptysis  ROS: See pertinent positives and negatives per HPI. No rash VD  Had lfu vaccine this year  Past Medical History  Diagnosis Date  . Arthritis   . PONV (postoperative nausea and vomiting)   . Acute appendicitis 03/20/2011  . Struck by horse 09/29/2012    Family History  Problem Relation Age of Onset  . Arthritis Mother   . Autoimmune disease Mother     of liver  . Atrial fibrillation Father   . Thyroid cancer Father   . Lung cancer Father   . Asthma Brother     Social History   Social History  . Marital Status: Married    Spouse Name: N/A  . Number of Children: N/A  . Years of Education: N/A   Social History Main Topics  . Smoking status: Never Smoker   . Smokeless tobacco: Never Used  . Alcohol Use: Yes     Comment: 1x/week  . Drug Use: No  . Sexual Activity: Not Asked   Other Topics Concern  . 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  Outside  And horses.  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    Neg ETS Firearms        HS education                No outpatient prescriptions prior to visit.   No facility-administered medications prior to visit.     EXAM:  BP 116/76 mmHg  Pulse 83  Temp(Src) 99 F (37.2 C) (Oral)  Wt 184 lb 1.6 oz (83.507 kg)   SpO2 98%  Body mass index is 27.39 kg/(m^2).  GENERAL: vitals reviewed and listed above, alert, oriented, appears well hydrated and in no acute distress mildy ill  HEENT: atraumatic, conjunctiva  clear, no obvious abnormalities on inspection of external nose and earstmx clear nares congested mildly  OP : no lesion edema or exudate  NECK: no obvious masses on inspection palpation no adenopathy LUNGS: clear to auscultation bilaterally, no wheezes, rales or rhonchi, good air movement CV: HRRR, no clubbing cyanosis or  peripheral edema nl cap refill  MS: moves all extremities without noticeable focal  abnormality Skin: normal capillary refill ,turgor , color: No acute rashes ,petechiae or bruising   ASSESSMENT AND PLAN:  Discussed the following assessment and plan:  Fever, unspecified - Plan: POCT Influenza A  Influenza-like illness   Risk benefit of medication discussed. Candidate for tamiflu  Early illness  Limitation of flu screen  Change  cpx appt to next week  29th.. 3 45    Expectant management. And fu  No exercise with fever -Patient advised to return or notify health care team  if symptoms worsen ,persist or new concerns arise.  Patient Instructions  This acts like influenza  That is common in the community right now  Now signs of pneumonia.  Influenza, Adult Influenza ("the flu") is a viral infection of the respiratory tract. It occurs more often in winter months because people spend more time in close contact with one another. Influenza can make you feel very sick. Influenza easily spreads from person to person (contagious). CAUSES  Influenza is caused by a virus that infects the respiratory tract. You can catch the virus by breathing in droplets from an infected person's cough or sneeze. You can also catch the virus by touching something that was recently contaminated with the virus and then touching your mouth, nose, or eyes. RISKS AND COMPLICATIONS You may be at risk for  a more severe case of influenza if you smoke cigarettes, have diabetes, have chronic heart disease (such as heart failure) or lung disease (such as asthma), or if you have a weakened immune system. Elderly people and pregnant women are also at risk for more serious infections. The most common problem of influenza is a lung infection (pneumonia). Sometimes, this problem can require emergency medical care and may be life threatening. SIGNS AND SYMPTOMS  Symptoms typically last 4 to 10 days and may include:  Fever.  Chills.  Headache, body aches, and muscle aches.  Sore throat.  Chest discomfort and cough.  Poor appetite.  Weakness or feeling tired.  Dizziness.  Nausea or vomiting. DIAGNOSIS  Diagnosis of influenza is often made based on your history and a physical exam. A nose or throat swab test can be done to confirm the diagnosis. TREATMENT  In mild cases, influenza goes away on its own. Treatment is directed at relieving symptoms. For more severe cases, your health care provider may prescribe antiviral medicines to shorten the sickness. Antibiotic medicines are not effective because the infection is caused by a virus, not by bacteria. HOME CARE INSTRUCTIONS  Take medicines only as directed by your health care provider.  Use a cool mist humidifier to make breathing easier.  Get plenty of rest until your temperature returns to normal. This usually takes 3 to 4 days.  Drink enough fluid to keep your urine clear or pale yellow.  Cover yourmouth and nosewhen coughing or sneezing,and wash your handswellto prevent thevirusfrom spreading.  Stay homefromwork orschool untilthe fever is gonefor at least 101full day. PREVENTION  An annual influenza vaccination (flu shot) is the best way to avoid getting influenza. An annual flu shot is now routinely recommended for all adults in the [place] SEEK MEDICAL CARE IF:  You experiencechest pain, yourcough worsens,or you  producemore mucus.  Youhave nausea,vomiting, ordiarrhea.  Your fever returns or gets worse. SEEK IMMEDIATE MEDICAL CARE IF:  You havetrouble breathing, you become short of breath,or your skin ornails becomebluish.  You have severe painor stiffnessin the neck.  You develop a sudden headache, or pain in the face or ear.  You have nausea or vomiting that you cannot control. MAKE SURE YOU:   Understand these instructions.  Will watch your condition.  Will get help right away if you are not doing well or get worse.   This information is not intended to replace advice given to you by your health care provider. Make sure you discuss any questions you have with  your health care provider.   Document Released: 03/09/2000 Document Revised: 04/02/2014 Document Reviewed: 06/11/2011 Elsevier Interactive Patient Education 2016 [employer].      Ko Vaya. Ronetta Molla M.D.

## 2015-06-14 ENCOUNTER — Telehealth: Payer: Self-pay | Admitting: Internal Medicine

## 2015-06-14 ENCOUNTER — Encounter: Payer: Self-pay | Admitting: Internal Medicine

## 2015-06-14 NOTE — Telephone Encounter (Signed)
Patient Name: Sergio NeighborsJEFFREY Ohlinger DOB: 04/21/1970 Initial Comment Caller states Dr Fabian SharpPanosh put on tamiflu yesterday; taking ibuprofen with it and still has temp 100.2 or 100.5, sometimes down to 99; still has headache; something better to take? Nurse Assessment Nurse: Roma KayserForsythe, RN, Santina Evansatherine Date/Time (Eastern Time): 06/14/2015 12:48:09 PM Confirm and document reason for call. If symptomatic, describe symptoms. You must click the next button to save text entered. ---caller states he was seen in office yesterday and dx flu, started on tamiflu and still running temp 100.0, Has the patient traveled out of the country within the last 30 days? ---Not Applicable Does the patient have any new or worsening symptoms? ---Yes Will a triage be completed? ---Yes Related visit to physician within the last 2 weeks? ---Yes Does the PT have any chronic conditions? (i.e. diabetes, asthma, etc.) ---No Is this a behavioral health or substance abuse call? ---No Guidelines Guideline Title Affirmed Question Affirmed Notes Influenza Follow-up Call [1] Influenza (diagnosed by HCP) AND [2] no complications (all triage questions negative) Final Disposition User Home Care Roma KayserForsythe, RN, Santina Evansatherine Disagree/Comply: Comply

## 2015-06-15 ENCOUNTER — Other Ambulatory Visit: Payer: Self-pay | Admitting: Family Medicine

## 2015-06-15 DIAGNOSIS — R509 Fever, unspecified: Secondary | ICD-10-CM

## 2015-06-15 DIAGNOSIS — R05 Cough: Secondary | ICD-10-CM

## 2015-06-15 DIAGNOSIS — R059 Cough, unspecified: Secondary | ICD-10-CM

## 2015-06-15 NOTE — Telephone Encounter (Signed)
Spoke to the pt.  At 12 noon his fever was 103.  He has taken 3 ibuprofen and now down to 102.4.  Vomited 3 x this morning.  No rash.  Continues to have cough. [facility]  Pt to go on 06/16/15 for imaging.

## 2015-06-15 NOTE — Telephone Encounter (Signed)
Noted   [facility] yesterday Call patient   ? Fever and amount ?

## 2015-06-16 ENCOUNTER — Encounter: Payer: Self-pay | Admitting: Internal Medicine

## 2015-06-16 ENCOUNTER — Ambulatory Visit (INDEPENDENT_AMBULATORY_CARE_PROVIDER_SITE_OTHER)
Admission: RE | Admit: 2015-06-16 | Discharge: 2015-06-16 | Disposition: A | Discharge status: Home / Self Care | Payer: 59 | Source: Ambulatory Visit | Attending: Internal Medicine | Admitting: Internal Medicine

## 2015-06-16 ENCOUNTER — Ambulatory Visit (INDEPENDENT_AMBULATORY_CARE_PROVIDER_SITE_OTHER): Payer: 59 | Admitting: Internal Medicine

## 2015-06-16 DIAGNOSIS — R05 Cough: Secondary | ICD-10-CM | POA: Diagnosis not present

## 2015-06-16 DIAGNOSIS — R509 Fever, unspecified: Secondary | ICD-10-CM

## 2015-06-16 DIAGNOSIS — J111 Influenza due to unidentified influenza virus with other respiratory manifestations: Secondary | ICD-10-CM | POA: Diagnosis not present

## 2015-06-16 DIAGNOSIS — R69 Illness, unspecified: Principal | ICD-10-CM

## 2015-06-16 DIAGNOSIS — J989 Respiratory disorder, unspecified: Secondary | ICD-10-CM

## 2015-06-16 DIAGNOSIS — R059 Cough, unspecified: Secondary | ICD-10-CM

## 2015-06-16 NOTE — Progress Notes (Signed)
Pre visit review using our clinic review tool, if applicable. No additional management support is needed unless otherwise documented below in the visit note.  Chief Complaint  Patient presents with  . Follow-up    fever cough flu like    HPI: Sergio Hampton 45 y.o.  comesin for fu of flu like ilness fever  See notes yesterday had feer 103   Since then last night no fever? 99  Took ibyu this am  Feels tired but a lot better than yesterday ,cough up nasty stuff no blood green  phegm no sob hemoptysis  Cp . Ate this am fluids ok  Last day of tamiflu  ROS: See pertinent positives and negatives per HPI. No rash Past Medical History  Diagnosis Date  . Arthritis   . PONV (postoperative nausea and vomiting)   . Acute appendicitis 03/20/2011  . Struck by horse 09/29/2012    Family History  Problem Relation Age of Onset  . Arthritis Mother   . Autoimmune disease Mother     of liver  . Atrial fibrillation Father   . Thyroid cancer Father   . Lung cancer Father   . Asthma Brother     Social History   Social History  . Marital Status: Married    Spouse Name: N/A  . Number of Children: N/A  . Years of Education: N/A   Social History Main Topics  . Smoking status: Never Smoker   . Smokeless tobacco: Never Used  . Alcohol Use: Yes     Comment: 1x/week  . Drug Use: No  . Sexual Activity: Not Asked   Other Topics Concern  . 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  Outside  And horses.  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    Neg ETS Firearms        HS education                Outpatient Prescriptions Prior to Visit  Medication Sig Dispense Refill  . oseltamivir (TAMIFLU) 75 MG capsule Take 1 capsule (75 mg total) by mouth 2 (two) times daily. 10 capsule 0   No facility-administered medications prior to visit.     EXAM:  BP 116/78 mmHg  Pulse 81  Temp(Src) 98.5 F  (36.9 C) (Oral)  Wt 176 lb 1.6 oz (79.878 kg)  SpO2 98%  Body mass index is 26.2 kg/(m^2).  GENERAL: vitals reviewed and listed above, alert, oriented, appears well hydrated and in no acute distress non toxic tired ill otherwise  HEENT: atraumatic, conjunctiva  clear, no obvious abnormalities on inspection of external nose and ears congested OP : no lesion edema or exudate  NECK: no obvious masses on inspection palpation  LUNGS: clear to auscultation bilaterally, no wheezes, rales or rhonchi, good air movement CV: HRRR, no clubbing cyanosis or  peripheral edema nl cap refill  MS: moves all extremities without noticeable focal  Abnormality Skin: normal capillary refill ,turgor , color: No acute rashes ,petechiae or bruising Abdomen:  Sof,t normal bowel sounds without hepatosplenomegaly, no guarding rebound or masses no CVA tenderness PSYCH: pleasant and cooperative, no obvious depression or anxiety  c xray nad  ASSESSMENT AND PLAN:  Discussed the following assessment and plan:  Influenza-like illness  Respiratory illness with fever Still acts like flu but let us know if relapsing  Sx  tomorrow before weekend .  -Patient advised to return or notify health care team  if symptoms worsen ,persist or new concerns arise.  Patient Instructions  Chest exam and  X ray was normal .   Reassuring    Monitor  for fever  and if relapsing over 100.5  Call tomorrow  before the weekend .  Otherwise  Rest fluids and see you next week    Burna Mortimer K. Karlina Suares M.D.

## 2015-06-16 NOTE — Patient Instructions (Signed)
Chest exam and  X ray was normal .   Reassuring    Monitor  for fever  and if relapsing over 100.5  Call tomorrow  before the weekend .  Otherwise  Rest fluids and see you next week

## 2015-06-17 ENCOUNTER — Telehealth: Payer: Self-pay | Admitting: Internal Medicine

## 2015-06-17 NOTE — Telephone Encounter (Signed)
Noted  

## 2015-06-17 NOTE — Telephone Encounter (Signed)
Pt call to say that Dr Fabian SharpPanosh asked him to call and let her know how he is doing. Pt call today to say everything is fine

## 2015-06-21 NOTE — Progress Notes (Signed)
Chief Complaint  Patient presents with  . Annual Exam    HPI: Patient  Sergio Hampton  45 y.o. comes in today for Preventive Health Care visit  Better  Still ocass cough much better    Health Maintenance  Topic Date Due  . HIV Screening  07/10/1986  . INFLUENZA VACCINE  10/25/2014  . TETANUS/TDAP  05/09/2020   Health Maintenance Review LIFESTYLE:  Exercise:   Desk plus  Tobacco/ETS: no Alcohol:  Not much 1 per week  Sugar beverages: not a lot some gatorade  Sleep: as much sa possible  Drug use: no    ROS:  Mole on back for years wife concern  No change?  GEN/ HEENT: No fever, significant weight changes sweats headaches vision problems hearing changes, CV/ PULM; No chest pain shortness of breath cough, syncope,edema  change in exercise tolerance. GI /GU: No adominal pain, vomiting, change in bowel habits. No blood in the stool. No significant GU symptoms. SKIN/HEME: ,no acute skin rashes suspicious lesions or bleeding. No lymphadenopathy, nodules, masses.  NEURO/ PSYCH:  No neurologic signs such as weakness numbness. No depression anxiety. IMM/ Allergy: No unusual infections.  Allergy .   REST of 12 system review negative except as per HPI   Past Medical History  Diagnosis Date  . Arthritis   . PONV (postoperative nausea and vomiting)   . Acute appendicitis 03/20/2011  . Struck by horse 09/29/2012    Past Surgical History  Procedure Laterality Date  . Cosmetic surgery      on face  . Cystoscopy    . Laparoscopic appendectomy  03/20/2011    Procedure: APPENDECTOMY LAPAROSCOPIC;  Surgeon: Adolph Pollackodd J Rosenbower, MD;  Location: [facility];  Service: General;  Laterality: N/A;    Family History  Problem Relation Age of Onset  . Arthritis Mother   . Autoimmune disease Mother     of liver  . Atrial fibrillation Father   . Thyroid cancer Father   . Lung cancer Father   . Asthma Brother     Social History   Social History  . Marital Status: Married    Spouse Name:  N/A  . Number of Children: N/A  . Years of Education: N/A   Social History Main Topics  . Smoking status: Never Smoker   . Smokeless tobacco: Never Used  . Alcohol Use: Yes     Comment: 1x/week  . Drug Use: No  . Sexual Activity: Not Asked   Other Topics Concern  . 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  Outside  And horses.  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    Neg [employer] Firearms        HS education                Outpatient Prescriptions Prior to Visit  Medication Sig Dispense Refill  . oseltamivir (TAMIFLU) 75 MG capsule Take 1 capsule (75 mg total) by mouth 2 (two) times daily. 10 capsule 0   No facility-administered medications prior to visit.     EXAM:  BP 110/76 mmHg  Temp(Src) 98.5 F (36.9 C) (Oral)  Ht 5' 8.75" (1.746 m)  Wt 179 lb 6.4 oz (81.375 kg)  BMI 26.69 kg/m2  Body mass index is 26.69 kg/(m^2).  Physical Exam: Vital signs reviewed ZDG:LOVFGEN:This is a well-developed well-nourished alert cooperative  who appearsr stated age in no acute distress.  ocass cough HEENT: normocephalic atraumatic , Eyes: PERRL EOM's full, conjunctiva clear, Nares: paten,t no deformity discharge or tenderness., Ears: no deformity EAC's clear TMs with normal landmarks. Mouth: clear OP, no lesions, edema.  Moist mucous membranes. Dentition in adequate repair. NECK: supple without masses, thyromegaly or bruits. CHEST/PULM:  Clear to auscultation and percussion breath sounds equal no wheeze , rales or rhonchi. No chest wall deformities or tenderness. CV: PMI is nondisplaced, S1 S2 no gallops, murmurs, rubs. Peripheral pulses are full without delay.No JVD .  ABDOMEN: Bowel sounds normal nontender  No guard or rebound, no hepato splenomegal no CVA tenderness.  No hernia. Extremtities:  No clubbing cyanosis or edema, no acute joint swelling or redness no focal atrophy NEURO:  Oriented  x3, cranial nerves 3-12 appear to be intact, no obvious focal weakness,gait within normal limits no abnormal reflexes or asymmetrical SKIN: No acute rashes normal turgor, color, no bruising or petechiae. PSYCH: Oriented, good eye contact, no obvious depression anxiety, cognition and judgment appear normal. LN: no cervical axillary inguinal adenopathy  Lab Results  Component Value Date   WBC 7.0 05/31/2015   HGB 17.5* 05/31/2015   HCT 50.7 05/31/2015   PLT 214.0 05/31/2015   GLUCOSE 96 05/31/2015   CHOL 169 05/31/2015   TRIG 155.0* 05/31/2015   HDL 35.80* 05/31/2015   LDLCALC 102* 05/31/2015   ALT 30 05/31/2015   AST 23 05/31/2015   NA 143 05/31/2015   K 4.6 05/31/2015   CL 105 05/31/2015   CREATININE 1.17 05/31/2015   BUN 17 05/31/2015   CO2 27 05/31/2015   TSH 2.16 05/31/2015    ASSESSMENT AND PLAN:  Discussed the following assessment and plan:  Visit for preventive health examination  Low HDL (under 40) Inc hg  Will follow   .  Yearly  Or as needed  Counseled. Dec eating out in exercise and  Take lunch instead of buying Lab yearly or as  Indicated  Influenza  Recent  Patient Care Team: Madelin Headings, MD as PCP - General (Internal Medicine) Patient Instructions   attend  To  lifestyle intervention healthy eating and exercise . Eating out less  Avoiding fried foods  Low hdl can be increased by more exercise and avoid transfats. Check repeat blood count  Yearly  To make sure hg not tracking up   Can Consider adding one asa 81  per day .     Risk is bleeding .   Not a certain benefit   Health Maintenance, Male A healthy lifestyle and preventative care can promote health and wellness.  Maintain regular health, dental, and eye exams.  Eat a healthy diet. Foods like vegetables, fruits, whole grains, low-fat dairy products, and lean protein foods contain the nutrients you need and are low in calories. Decrease your intake of foods high in solid fats, added sugars, and  salt. Get information about a proper diet from your health care provider, if necessary.  Regular physical exercise is one of the most important things you can do for your health. Most adults should get at least 150 minutes of moderate-intensity exercise (any activity that increases your heart rate and causes you to sweat) each week. In addition, most adults need muscle-strengthening exercises on 2 or more days a week.   Maintain a healthy weight. The body mass index (BMI) is a screening tool to identify possible weight problems. It provides an estimate of body fat based on  height and weight. Your health care provider can find your BMI and can help you achieve or maintain a healthy weight. For males 20 years and older:  A BMI below 18.5 is considered underweight.  A BMI of 18.5 to 24.9 is normal.  A BMI of 25 to 29.9 is considered overweight.  A BMI of 30 and above is considered obese.  Maintain normal blood lipids and cholesterol by exercising and minimizing your intake of saturated fat. Eat a balanced diet with plenty of fruits and vegetables. Blood tests for lipids and cholesterol should begin at age 48 and be repeated every 5 years. If your lipid or cholesterol levels are high, you are over age 53, or you are at high risk for heart disease, you may need your cholesterol levels checked more frequently.Ongoing high lipid and cholesterol levels should be treated with medicines if diet and exercise are not working.  If you smoke, find out from your health care provider how to quit. If you do not use tobacco, do not start.  Lung cancer screening is recommended for adults aged 55-80 years who are at high risk for developing lung cancer because of a history of smoking. A yearly low-dose CT scan of the lungs is recommended for people who have at least a 30-pack-year history of smoking and are current smokers or have quit within the past 15 years. A pack year of smoking is smoking an average of 1 pack  of cigarettes a day for 1 year (for example, a 30-pack-year history of smoking could mean smoking 1 pack a day for 30 years or 2 packs a day for 15 years). Yearly screening should continue until the smoker has stopped smoking for at least 15 years. Yearly screening should be stopped for people who develop a health problem that would prevent them from having lung cancer treatment.  If you choose to drink alcohol, do not have more than 2 drinks per day. One drink is considered to be 12 oz (360 mL) of beer, 5 oz (150 mL) of wine, or 1.5 oz (45 mL) of liquor.  Avoid the use of street drugs. Do not share needles with anyone. Ask for help if you need support or instructions about stopping the use of drugs.  High blood pressure causes heart disease and increases the risk of stroke. High blood pressure is more likely to develop in:  People who have blood pressure in the end of the normal range (100-139/85-89 mm Hg).  People who are overweight or obese.  People who are African American.  If you are 41-86 years of age, have your blood pressure checked every 3-5 years. If you are 59 years of age or older, have your blood pressure checked every year. You should have your blood pressure measured twice--once when you are at a hospital or clinic, and once when you are not at a hospital or clinic. Record the average of the two measurements. To check your blood pressure when you are not at a hospital or clinic, you can use:  An automated blood pressure machine at a pharmacy.  A home blood pressure monitor.  If you are 3-75 years old, ask your health care provider if you should take aspirin to prevent heart disease.  Diabetes screening involves taking a blood sample to check your fasting blood sugar level. This should be done once every 3 years after age 20 if you are at a normal weight and without risk factors for diabetes. Testing should be considered  at a younger age or be carried out more frequently if you  are overweight and have at least 1 risk factor for diabetes.  Colorectal cancer can be detected and often prevented. Most routine colorectal cancer screening begins at the age of 16 and continues through age 81. However, your health care provider may recommend screening at an earlier age if you have risk factors for colon cancer. On a yearly basis, your health care provider may provide home test kits to check for hidden blood in the stool. A small camera at the end of a tube may be used to directly examine the colon (sigmoidoscopy or colonoscopy) to detect the earliest forms of colorectal cancer. Talk to your health care provider about this at age 43 when routine screening begins. A direct exam of the colon should be repeated every 5-10 years through age 4, unless early forms of precancerous polyps or small growths are found.  People who are at an increased risk for hepatitis B should be screened for this virus. You are considered at high risk for hepatitis B if:  You were born in a country where hepatitis B occurs often. Talk with your health care provider about which countries are considered high risk.  Your parents were born in a high-risk country and you have not received a shot to protect against hepatitis B (hepatitis B vaccine).  You have HIV or AIDS.  You use needles to inject street drugs.  You live with, or have sex with, someone who has hepatitis B.  You are a man who has sex with other men (MSM).  You get hemodialysis treatment.  You take certain medicines for conditions like cancer, organ transplantation, and autoimmune conditions.  Hepatitis C blood testing is recommended for all people born from [birth year] through 1964 and any individual with known risk factors for hepatitis C.  Healthy men should no longer receive prostate-specific antigen (PSA) blood tests as part of routine cancer screening. Talk to your health care provider about prostate cancer screening.  Testicular cancer  screening is not recommended for adolescents or adult males who have no symptoms. Screening includes self-exam, a health care provider exam, and other screening tests. Consult with your health care provider about any symptoms you have or any concerns you have about testicular cancer.  Practice safe sex. Use condoms and avoid high-risk sexual practices to reduce the spread of sexually transmitted infections (STIs).  You should be screened for STIs, including gonorrhea and chlamydia if:  You are sexually active and are younger than 24 years.  You are older than 24 years, and your health care provider tells you that you are at risk for this type of infection.  Your sexual activity has changed since you were last screened, and you are at an increased risk for chlamydia or gonorrhea. Ask your health care provider if you are at risk.  If you are at risk of being infected with HIV, it is recommended that you take a prescription medicine daily to prevent HIV infection. This is called pre-exposure prophylaxis (PrEP). You are considered at risk if:  You are a man who has sex with other men (MSM).  You are a heterosexual man who is sexually active with multiple partners.  You take drugs by injection.  You are sexually active with a partner who has HIV.  Talk with your health care provider about whether you are at high risk of being infected with HIV. If you choose to begin PrEP, you should first  be tested for HIV. You should then be tested every 3 months for as long as you are taking PrEP.  Use sunscreen. Apply sunscreen liberally and repeatedly throughout the day. You should seek shade when your shadow is shorter than you. Protect yourself by wearing long sleeves, pants, a wide-brimmed hat, and sunglasses year round whenever you are outdoors.  Tell your health care provider of new moles or changes in moles, especially if there is a change in shape or color. Also, tell your health care provider if a  mole is larger than the size of a pencil eraser.  A one-time screening for abdominal aortic aneurysm (AAA) and surgical repair of large AAAs by ultrasound is recommended for men aged 65-75 years who are current or former smokers.  Stay current with your vaccines (immunizations).   This information is not intended to replace advice given to you by your health care provider. Make sure you discuss any questions you have with your health care provider.   Document Released: 09/08/2007 Document Revised: 04/02/2014 Document Reviewed: 08/07/2010 Elsevier Interactive Patient Education [employer]      Why follow it? Research shows. . Those who follow the Mediterranean diet have a reduced risk of heart disease  . The diet is associated with a reduced incidence of Parkinson's and Alzheimer's diseases . People following the diet may have longer life expectancies and lower rates of chronic diseases  . The Dietary Guidelines for Americans recommends the Mediterranean diet as an eating plan to promote health and prevent disease  What Is the Mediterranean Diet?  . Healthy eating plan based on typical foods and recipes of Mediterranean-style cooking . The diet is primarily a plant based diet; these foods should make up a majority of meals   Starches - Plant based foods should make up a majority of meals - They are an important sources of vitamins, minerals, energy, antioxidants, and fiber - Choose whole grains, foods high in fiber and minimally processed items  - Typical grain sources include wheat, oats, barley, corn, brown rice, bulgar, farro, millet, polenta, couscous  - Various types of beans include chickpeas, lentils, fava beans, black beans, white beans   Fruits  Veggies - Large quantities of antioxidant rich fruits & veggies; 6 or more servings  - Vegetables can be eaten raw or lightly drizzled with oil and cooked  - Vegetables common to the traditional Mediterranean Diet include:  artichokes, arugula, beets, broccoli, brussel sprouts, cabbage, carrots, celery, collard greens, cucumbers, eggplant, kale, leeks, lemons, lettuce, mushrooms, okra, onions, peas, peppers, potatoes, pumpkin, radishes, rutabaga, shallots, spinach, sweet potatoes, turnips, zucchini - Fruits common to the Mediterranean Diet include: apples, apricots, avocados, cherries, clementines, dates, figs, grapefruits, grapes, melons, nectarines, oranges, peaches, pears, pomegranates, strawberries, tangerines  Fats - Replace butter and margarine with healthy oils, such as olive oil, canola oil, and tahini  - Limit nuts to no more than a handful a day  - Nuts include walnuts, almonds, pecans, pistachios, pine nuts  - Limit or avoid candied, honey roasted or heavily salted nuts - Olives are central to the [employer] - can be eaten whole or used in a variety of dishes   Meats Protein - Limiting red meat: no more than a few times a month - When eating red meat: choose lean cuts and keep the portion to the size of deck of cards - Eggs: approx. 0 to 4 times a week  - Fish and lean poultry: at least 2 a week  -  Healthy protein sources include, chicken, [place], lean beef, lamb - Increase intake of seafood such as tuna, salmon, trout, mackerel, shrimp, scallops - Avoid or limit high fat processed meats such as sausage and bacon  Dairy - Include moderate amounts of low fat dairy products  - Focus on healthy dairy such as fat free yogurt, skim milk, low or reduced fat cheese - Limit dairy products higher in fat such as whole or 2% milk, cheese, ice cream  Alcohol - Moderate amounts of red wine is ok  - No more than 5 oz daily for women (all ages) and men older than age 19  - No more than 10 oz of wine daily for men younger than 34  Other - Limit sweets and other desserts  - Use herbs and spices instead of salt to flavor foods  - Herbs and spices common to the traditional Mediterranean Diet include: basil, bay  leaves, chives, cloves, cumin, fennel, garlic, lavender, marjoram, mint, oregano, parsley, pepper, rosemary, sage, savory, sumac, tarragon, thyme   It's not just a diet, it's a lifestyle:  . The Mediterranean diet includes lifestyle factors typical of those in the region  . Foods, drinks and meals are best eaten with others and savored . Daily physical activity is important for overall good health . This could be strenuous exercise like running and aerobics . This could also be more leisurely activities such as walking, housework, yard-work, or taking the stairs . Moderation is the key; a balanced and healthy diet accommodates most foods and drinks . Consider portion sizes and frequency of consumption of certain foods   Meal Ideas & Options:  . Breakfast:  o Whole wheat toast or whole wheat English muffins with peanut butter & hard boiled egg o Steel cut oats topped with apples & cinnamon and skim milk  o Fresh fruit: banana, strawberries, melon, berries, peaches  o Smoothies: strawberries, bananas, greek yogurt, peanut butter o Low fat greek yogurt with blueberries and granola  o Egg white omelet with spinach and mushrooms o Breakfast couscous: whole wheat couscous, apricots, skim milk, cranberries  . Sandwiches:  o Hummus and grilled vegetables (peppers, zucchini, squash) on whole wheat bread   o Grilled chicken on whole wheat pita with lettuce, tomatoes, cucumbers or tzatziki  o Tuna salad on whole wheat bread: tuna salad made with greek yogurt, olives, red peppers, capers, green onions o Garlic rosemary lamb pita: lamb sauted with garlic, rosemary, salt & pepper; add lettuce, cucumber, greek yogurt to pita - flavor with lemon juice and black pepper  . Seafood:  o Mediterranean grilled salmon, seasoned with garlic, basil, parsley, lemon juice and black pepper o Shrimp, lemon, and spinach whole-grain pasta salad made with low fat greek yogurt  o Seared scallops with lemon orzo   o Seared tuna steaks seasoned salt, pepper, coriander topped with tomato mixture of olives, tomatoes, olive oil, minced garlic, parsley, green onions and cappers  . Meats:  o Herbed greek chicken salad with kalamata olives, cucumber, feta  o Red bell peppers stuffed with spinach, bulgur, lean ground beef (or lentils) & topped with feta   o Kebabs: skewers of chicken, tomatoes, onions, zucchini, squash  o [place] burgers: made with red onions, mint, dill, lemon juice, feta cheese topped with roasted red peppers . Vegetarian o Cucumber salad: cucumbers, artichoke hearts, celery, red onion, feta cheese, tossed in olive oil & lemon juice  o Hummus and whole grain pita points with a greek salad (lettuce, tomato,  feta, olives, cucumbers, red onion) o Lentil soup with celery, carrots made with vegetable broth, garlic, salt and pepper  o Tabouli salad: parsley, bulgur, mint, scallions, cucumbers, tomato, radishes, lemon juice, olive oil, salt and pepper.         Neta Mends. Panosh M.D.

## 2015-06-21 NOTE — Patient Instructions (Addendum)
attend  To  lifestyle intervention healthy eating and exercise . Eating out less  Avoiding fried foods  Low hdl can be increased by more exercise and avoid transfats. Check repeat blood count  Yearly  To make sure hg not tracking up   Can Consider adding one asa 81  per day .     Risk is bleeding .   Not a certain benefit   Health Maintenance, Male A healthy lifestyle and preventative care can promote health and wellness.  Maintain regular health, dental, and eye exams.  Eat a healthy diet. Foods like vegetables, fruits, whole grains, low-fat dairy products, and lean protein foods contain the nutrients you need and are low in calories. Decrease your intake of foods high in solid fats, added sugars, and salt. Get information about a proper diet from your health care provider, if necessary.  Regular physical exercise is one of the most important things you can do for your health. Most adults should get at least 150 minutes of moderate-intensity exercise (any activity that increases your heart rate and causes you to sweat) each week. In addition, most adults need muscle-strengthening exercises on 2 or more days a week.   Maintain a healthy weight. The body mass index (BMI) is a screening tool to identify possible weight problems. It provides an estimate of body fat based on height and weight. Your health care provider can find your BMI and can help you achieve or maintain a healthy weight. For males 20 years and older:  A BMI below 18.5 is considered underweight.  A BMI of 18.5 to 24.9 is normal.  A BMI of 25 to 29.9 is considered overweight.  A BMI of 30 and above is considered obese.  Maintain normal blood lipids and cholesterol by exercising and minimizing your intake of saturated fat. Eat a balanced diet with plenty of fruits and vegetables. Blood tests for lipids and cholesterol should begin at age 67 and be repeated every 5 years. If your lipid or cholesterol levels are high, you are  over age 33, or you are at high risk for heart disease, you may need your cholesterol levels checked more frequently.Ongoing high lipid and cholesterol levels should be treated with medicines if diet and exercise are not working.  If you smoke, find out from your health care provider how to quit. If you do not use tobacco, do not start.  Lung cancer screening is recommended for adults aged 55-80 years who are at high risk for developing lung cancer because of a history of smoking. A yearly low-dose CT scan of the lungs is recommended for people who have at least a 30-pack-year history of smoking and are current smokers or have quit within the past 15 years. A pack year of smoking is smoking an average of 1 pack of cigarettes a day for 1 year (for example, a 30-pack-year history of smoking could mean smoking 1 pack a day for 30 years or 2 packs a day for 15 years). Yearly screening should continue until the smoker has stopped smoking for at least 15 years. Yearly screening should be stopped for people who develop a health problem that would prevent them from having lung cancer treatment.  If you choose to drink alcohol, do not have more than 2 drinks per day. One drink is considered to be 12 oz (360 mL) of beer, 5 oz (150 mL) of wine, or 1.5 oz (45 mL) of liquor.  Avoid the use of street drugs. Do  not share needles with anyone. Ask for help if you need support or instructions about stopping the use of drugs.  High blood pressure causes heart disease and increases the risk of stroke. High blood pressure is more likely to develop in:  People who have blood pressure in the end of the normal range (100-139/85-89 mm Hg).  People who are overweight or obese.  People who are African American.  If you are 58-24 years of age, have your blood pressure checked every 3-5 years. If you are 5 years of age or older, have your blood pressure checked every year. You should have your blood pressure measured  twice--once when you are at a hospital or clinic, and once when you are not at a hospital or clinic. Record the average of the two measurements. To check your blood pressure when you are not at a hospital or clinic, you can use:  An automated blood pressure machine at a pharmacy.  A home blood pressure monitor.  If you are 32-45 years old, ask your health care provider if you should take aspirin to prevent heart disease.  Diabetes screening involves taking a blood sample to check your fasting blood sugar level. This should be done once every 3 years after age 39 if you are at a normal weight and without risk factors for diabetes. Testing should be considered at a younger age or be carried out more frequently if you are overweight and have at least 1 risk factor for diabetes.  Colorectal cancer can be detected and often prevented. Most routine colorectal cancer screening begins at the age of 39 and continues through age 52. However, your health care provider may recommend screening at an earlier age if you have risk factors for colon cancer. On a yearly basis, your health care provider may provide home test kits to check for hidden blood in the stool. A small camera at the end of a tube may be used to directly examine the colon (sigmoidoscopy or colonoscopy) to detect the earliest forms of colorectal cancer. Talk to your health care provider about this at age 42 when routine screening begins. A direct exam of the colon should be repeated every 5-10 years through age 38, unless early forms of precancerous polyps or small growths are found.  People who are at an increased risk for hepatitis B should be screened for this virus. You are considered at high risk for hepatitis B if:  You were born in a country where hepatitis B occurs often. Talk with your health care provider about which countries are considered high risk.  Your parents were born in a high-risk country and you have not received a shot to  protect against hepatitis B (hepatitis B vaccine).  You have HIV or AIDS.  You use needles to inject street drugs.  You live with, or have sex with, someone who has hepatitis B.  You are a man who has sex with other men (MSM).  You get hemodialysis treatment.  You take certain medicines for conditions like cancer, organ transplantation, and autoimmune conditions.  Hepatitis C blood testing is recommended for all people born from [birth year] through 1964 and any individual with known risk factors for hepatitis C.  Healthy men should no longer receive prostate-specific antigen (PSA) blood tests as part of routine cancer screening. Talk to your health care provider about prostate cancer screening.  Testicular cancer screening is not recommended for adolescents or adult males who have no symptoms. Screening includes self-exam, a  health care provider exam, and other screening tests. Consult with your health care provider about any symptoms you have or any concerns you have about testicular cancer.  Practice safe sex. Use condoms and avoid high-risk sexual practices to reduce the spread of sexually transmitted infections (STIs).  You should be screened for STIs, including gonorrhea and chlamydia if:  You are sexually active and are younger than 24 years.  You are older than 24 years, and your health care provider tells you that you are at risk for this type of infection.  Your sexual activity has changed since you were last screened, and you are at an increased risk for chlamydia or gonorrhea. Ask your health care provider if you are at risk.  If you are at risk of being infected with HIV, it is recommended that you take a prescription medicine daily to prevent HIV infection. This is called pre-exposure prophylaxis (PrEP). You are considered at risk if:  You are a man who has sex with other men (MSM).  You are a heterosexual man who is sexually active with multiple partners.  You take drugs by  injection.  You are sexually active with a partner who has HIV.  Talk with your health care provider about whether you are at high risk of being infected with HIV. If you choose to begin PrEP, you should first be tested for HIV. You should then be tested every 3 months for as long as you are taking PrEP.  Use sunscreen. Apply sunscreen liberally and repeatedly throughout the day. You should seek shade when your shadow is shorter than you. Protect yourself by wearing long sleeves, pants, a wide-brimmed hat, and sunglasses year round whenever you are outdoors.  Tell your health care provider of new moles or changes in moles, especially if there is a change in shape or color. Also, tell your health care provider if a mole is larger than the size of a pencil eraser.  A one-time screening for abdominal aortic aneurysm (AAA) and surgical repair of large AAAs by ultrasound is recommended for men aged 65-75 years who are current or former smokers.  Stay current with your vaccines (immunizations).   This information is not intended to replace advice given to you by your health care provider. Make sure you discuss any questions you have with your health care provider.   Document Released: 09/08/2007 Document Revised: 04/02/2014 Document Reviewed: 08/07/2010 Elsevier Interactive Patient Education [employer]2016 [employer] Inc.      Why follow it? Research shows. . Those who follow the Mediterranean diet have a reduced risk of heart disease  . The diet is associated with a reduced incidence of Parkinson's and Alzheimer's diseases . People following the diet may have longer life expectancies and lower rates of chronic diseases  . The Dietary Guidelines for Americans recommends the Mediterranean diet as an eating plan to promote health and prevent disease  What Is the Mediterranean Diet?  . Healthy eating plan based on typical foods and recipes of Mediterranean-style cooking . The diet is primarily a plant based  diet; these foods should make up a majority of meals   Starches - Plant based foods should make up a majority of meals - They are an important sources of vitamins, minerals, energy, antioxidants, and fiber - Choose whole grains, foods high in fiber and minimally processed items  - Typical grain sources include wheat, oats, barley, corn, brown rice, bulgar, farro, millet, polenta, couscous  - Various types of beans include chickpeas,  lentils, fava beans, black beans, white beans   Fruits  Veggies - Large quantities of antioxidant rich fruits & veggies; 6 or more servings  - Vegetables can be eaten raw or lightly drizzled with oil and cooked  - Vegetables common to the traditional Mediterranean Diet include: artichokes, arugula, beets, broccoli, brussel sprouts, cabbage, carrots, celery, collard greens, cucumbers, eggplant, kale, leeks, lemons, lettuce, mushrooms, okra, onions, peas, peppers, potatoes, pumpkin, radishes, rutabaga, shallots, spinach, sweet potatoes, turnips, zucchini - Fruits common to the Mediterranean Diet include: apples, apricots, avocados, cherries, clementines, dates, figs, grapefruits, grapes, melons, nectarines, oranges, peaches, pears, pomegranates, strawberries, tangerines  Fats - Replace butter and margarine with healthy oils, such as olive oil, canola oil, and tahini  - Limit nuts to no more than a handful a day  - Nuts include walnuts, almonds, pecans, pistachios, pine nuts  - Limit or avoid candied, honey roasted or heavily salted nuts - Olives are central to the [employer] - can be eaten whole or used in a variety of dishes   Meats Protein - Limiting red meat: no more than a few times a month - When eating red meat: choose lean cuts and keep the portion to the size of deck of cards - Eggs: approx. 0 to 4 times a week  - Fish and lean poultry: at least 2 a week  - Healthy protein sources include, chicken, [place], lean beef, lamb - Increase intake of seafood  such as tuna, salmon, trout, mackerel, shrimp, scallops - Avoid or limit high fat processed meats such as sausage and bacon  Dairy - Include moderate amounts of low fat dairy products  - Focus on healthy dairy such as fat free yogurt, skim milk, low or reduced fat cheese - Limit dairy products higher in fat such as whole or 2% milk, cheese, ice cream  Alcohol - Moderate amounts of red wine is ok  - No more than 5 oz daily for women (all ages) and men older than age 36  - No more than 10 oz of wine daily for men younger than 17  Other - Limit sweets and other desserts  - Use herbs and spices instead of salt to flavor foods  - Herbs and spices common to the traditional Mediterranean Diet include: basil, bay leaves, chives, cloves, cumin, fennel, garlic, lavender, marjoram, mint, oregano, parsley, pepper, rosemary, sage, savory, sumac, tarragon, thyme   It's not just a diet, it's a lifestyle:  . The Mediterranean diet includes lifestyle factors typical of those in the region  . Foods, drinks and meals are best eaten with others and savored . Daily physical activity is important for overall good health . This could be strenuous exercise like running and aerobics . This could also be more leisurely activities such as walking, housework, yard-work, or taking the stairs . Moderation is the key; a balanced and healthy diet accommodates most foods and drinks . Consider portion sizes and frequency of consumption of certain foods   Meal Ideas & Options:  . Breakfast:  o Whole wheat toast or whole wheat English muffins with peanut butter & hard boiled egg o Steel cut oats topped with apples & cinnamon and skim milk  o Fresh fruit: banana, strawberries, melon, berries, peaches  o Smoothies: strawberries, bananas, greek yogurt, peanut butter o Low fat greek yogurt with blueberries and granola  o Egg white omelet with spinach and mushrooms o Breakfast couscous: whole wheat couscous, apricots, skim  milk, cranberries  . Sandwiches:  o Hummus and grilled vegetables (peppers, zucchini, squash) on whole wheat bread   o Grilled chicken on whole wheat pita with lettuce, tomatoes, cucumbers or tzatziki  o Tuna salad on whole wheat bread: tuna salad made with greek yogurt, olives, red peppers, capers, green onions o Garlic rosemary lamb pita: lamb sauted with garlic, rosemary, salt & pepper; add lettuce, cucumber, greek yogurt to pita - flavor with lemon juice and black pepper  . Seafood:  o Mediterranean grilled salmon, seasoned with garlic, basil, parsley, lemon juice and black pepper o Shrimp, lemon, and spinach whole-grain pasta salad made with low fat greek yogurt  o Seared scallops with lemon orzo  o Seared tuna steaks seasoned salt, pepper, coriander topped with tomato mixture of olives, tomatoes, olive oil, minced garlic, parsley, green onions and cappers  . Meats:  o Herbed greek chicken salad with kalamata olives, cucumber, feta  o Red bell peppers stuffed with spinach, bulgur, lean ground beef (or lentils) & topped with feta   o Kebabs: skewers of chicken, tomatoes, onions, zucchini, squash  o [place] burgers: made with red onions, mint, dill, lemon juice, feta cheese topped with roasted red peppers . Vegetarian o Cucumber salad: cucumbers, artichoke hearts, celery, red onion, feta cheese, tossed in olive oil & lemon juice  o Hummus and whole grain pita points with a greek salad (lettuce, tomato, feta, olives, cucumbers, red onion) o Lentil soup with celery, carrots made with vegetable broth, garlic, salt and pepper  o Tabouli salad: parsley, bulgur, mint, scallions, cucumbers, tomato, radishes, lemon juice, olive oil, salt and pepper.

## 2015-06-22 ENCOUNTER — Encounter: Payer: Self-pay | Admitting: Internal Medicine

## 2015-06-22 ENCOUNTER — Ambulatory Visit (INDEPENDENT_AMBULATORY_CARE_PROVIDER_SITE_OTHER): Payer: 59 | Admitting: Internal Medicine

## 2015-06-22 VITALS — BP 110/76 | Temp 98.5°F | Ht 68.75 in | Wt 179.4 lb

## 2015-06-22 DIAGNOSIS — E786 Lipoprotein deficiency: Secondary | ICD-10-CM | POA: Diagnosis not present

## 2015-06-22 DIAGNOSIS — Z0000 Encounter for general adult medical examination without abnormal findings: Secondary | ICD-10-CM

## 2015-08-18 NOTE — Progress Notes (Signed)
Chief Complaint  Patient presents with  . Fatigue  . Fever    HPI: Blair PromiseJeffrey M Fera 45 y.o.  comesin for acute visit today  Onset yesterday am and had  Upper resp drainage  Felt from weather but temp 99 ibuprofen  Low grade last pm and some sinus  Sx an no energy .  See  Last notes   Recovered achy no rash   He recovered from his flulike illness although has had persistent postnasal drainage throat clearing and upper respiratory congestion since that time. Might be slightly worse recently but there is a high pollen count. No shortness of breath but malaise over the last 2 days. Temperature 99 plus. Didn't want to wait  to come in. No recent tick bites but is outside his had multiple tick bites in the remote past ROS: See pertinent positives and negatives per HPI. No nvd   Past Medical History  Diagnosis Date  . Arthritis   . PONV (postoperative nausea and vomiting)   . Acute appendicitis 03/20/2011  . Struck by horse 09/29/2012    Family History  Problem Relation Age of Onset  . Arthritis Mother   . Autoimmune disease Mother     of liver  . Atrial fibrillation Father   . Thyroid cancer Father   . Lung cancer Father   . Asthma Brother     Social History   Social History  . Marital Status: Married    Spouse Name: N/A  . Number of Children: N/A  . Years of Education: N/A   Social History Main Topics  . Smoking status: Never Smoker   . Smokeless tobacco: Never Used  . Alcohol Use: Yes     Comment: 1x/week  . Drug Use: No  . Sexual Activity: Not Asked   Other Topics Concern  . 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  Outside  And horses.  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    Neg [employer]TS Firearms        HS education                No outpatient prescriptions prior to visit.   No facility-administered medications prior to visit.     EXAM:  BP 112/66 mmHg   Pulse 82  Temp(Src) 99.3 F (37.4 C) (Oral)  Ht 5\' 9"  (1.753 m)  Wt 179 lb 6.4 oz (81.375 kg)  BMI 26.48 kg/m2  SpO2 99%  Body mass index is 26.48 kg/(m^2).  GENERAL: vitals reviewed and listed above, alert, oriented, appears well hydrated and in no acute distress HEENT: atraumatic, conjunctiva  clear, no obvious abnormalities on inspection of external nose and ears congestd tms nl nares congested no face pain OP : no lesion edema or exudate  NECK: no obvious masses on inspection palpation  LUNGS: clear to auscultation bilaterally, no wheezes, rales or rhonchi, good air movement CV: HRRR, no clubbing cyanosis or  peripheral edema nl cap refill  MS: moves all extremities without noticeable focal  Abnormality Abdomen:  Sof,t normal bowel sounds without hepatosplenomegaly, no guarding rebound or masses no CVA tenderness Skin: normal capillary refill ,turgor , color: No acute rashes ,petechiae or bruising PSYCH: pleasant and cooperative, no obvious depression or anxiety Lab Results  Component Value Date   WBC 7.0 05/31/2015   HGB 17.5* 05/31/2015   HCT 50.7  05/31/2015   PLT 214.0 05/31/2015   GLUCOSE 96 05/31/2015   CHOL 169 05/31/2015   TRIG 155.0* 05/31/2015   HDL 35.80* 05/31/2015   LDLCALC 102* 05/31/2015   ALT 30 05/31/2015   AST 23 05/31/2015   NA 143 05/31/2015   K 4.6 05/31/2015   CL 105 05/31/2015   CREATININE 1.17 05/31/2015   BUN 17 05/31/2015   CO2 27 05/31/2015   TSH 2.16 05/31/2015    ASSESSMENT AND PLAN:  Discussed the following assessment and plan:  Fever, unspecified  Other fatigue Low-grade fever and fatigue with chronic upper respiratory congestion persisting after significant flulike illness. Probably new illness may be self-limiting viral illness however if increasing symptoms and treat for sinusitis. Gets high fever or shaking chills summer flulike symptoms begin doxycycline to cover for sick tick related diseases. Is a 3 day weekend. Otherwise he can  let this run its course contact us next week about need for follow-up. -Patient advised to return or notify health care team  if symptoms worsen ,persist or new concerns arise.  Patient Instructions    This could be just a viral infection run its course over the next week. However get fever 101 and greater please begin antibiotic that would cover bacteria and sinuses and any tick related diseases such as RMSF. If you get face pain like a sinus infection can also start the antibiotic. Fever is persisting contact us for further advice. Can contact the on-call service over the weekend for advice if needed.    Neta Mends. Leza Apsey M.D.

## 2015-08-19 ENCOUNTER — Encounter: Payer: Self-pay | Admitting: Internal Medicine

## 2015-08-19 ENCOUNTER — Ambulatory Visit (INDEPENDENT_AMBULATORY_CARE_PROVIDER_SITE_OTHER): Payer: 59 | Admitting: Internal Medicine

## 2015-08-19 VITALS — BP 112/66 | HR 82 | Temp 99.3°F | Ht 69.0 in | Wt 179.4 lb

## 2015-08-19 DIAGNOSIS — R5383 Other fatigue: Secondary | ICD-10-CM

## 2015-08-19 DIAGNOSIS — R509 Fever, unspecified: Secondary | ICD-10-CM

## 2015-08-19 MED ORDER — DOXYCYCLINE HYCLATE 100 MG PO TABS: 100.0000 mg | ORAL_TABLET | Freq: Two times a day (BID) | ORAL | Status: DC

## 2015-08-19 NOTE — Progress Notes (Signed)
Pre visit review using our clinic tool,if applicable. No additional management support is needed unless otherwise documented below in the visit note.  

## 2015-08-19 NOTE — Patient Instructions (Signed)
   This could be just a viral infection run its course over the next week. However get fever 101 and greater please begin antibiotic that would cover bacteria and sinuses and any tick related diseases such as RMSF. If you get face pain like a sinus infection can also start the antibiotic. Fever is persisting contact us for further advice. Can contact the on-call service over the weekend for advice if needed.

## 2015-11-01 ENCOUNTER — Encounter: Payer: Self-pay | Admitting: Internal Medicine

## 2016-01-11 ENCOUNTER — Ambulatory Visit (INDEPENDENT_AMBULATORY_CARE_PROVIDER_SITE_OTHER): Payer: 59

## 2016-01-11 DIAGNOSIS — Z23 Encounter for immunization: Secondary | ICD-10-CM

## 2016-12-20 ENCOUNTER — Ambulatory Visit (INDEPENDENT_AMBULATORY_CARE_PROVIDER_SITE_OTHER): Payer: 59 | Admitting: *Deleted

## 2016-12-20 DIAGNOSIS — Z23 Encounter for immunization: Secondary | ICD-10-CM

## 2017-06-12 DIAGNOSIS — J069 Acute upper respiratory infection, unspecified: Secondary | ICD-10-CM | POA: Diagnosis not present

## 2017-06-12 DIAGNOSIS — R509 Fever, unspecified: Secondary | ICD-10-CM | POA: Diagnosis not present

## 2017-06-12 DIAGNOSIS — J0100 Acute maxillary sinusitis, unspecified: Secondary | ICD-10-CM | POA: Diagnosis not present

## 2017-06-19 ENCOUNTER — Ambulatory Visit (INDEPENDENT_AMBULATORY_CARE_PROVIDER_SITE_OTHER)
Admission: RE | Admit: 2017-06-19 | Discharge: 2017-06-19 | Disposition: A | Discharge status: Home / Self Care | Payer: 59 | Source: Ambulatory Visit | Attending: Internal Medicine | Admitting: Internal Medicine

## 2017-06-19 ENCOUNTER — Encounter: Payer: Self-pay | Admitting: Internal Medicine

## 2017-06-19 ENCOUNTER — Ambulatory Visit: Payer: 59 | Admitting: Internal Medicine

## 2017-06-19 VITALS — BP 142/82 | HR 80 | Temp 98.2°F | Wt 184.0 lb

## 2017-06-19 DIAGNOSIS — R509 Fever, unspecified: Secondary | ICD-10-CM

## 2017-06-19 DIAGNOSIS — J329 Chronic sinusitis, unspecified: Secondary | ICD-10-CM

## 2017-06-19 DIAGNOSIS — J989 Respiratory disorder, unspecified: Secondary | ICD-10-CM | POA: Diagnosis not present

## 2017-06-19 DIAGNOSIS — R05 Cough: Secondary | ICD-10-CM | POA: Diagnosis not present

## 2017-06-19 LAB — CBC WITH DIFFERENTIAL/PLATELET
Basophils Absolute: 0.1 10*3/uL (ref 0.0–0.1)
Basophils Relative: 0.7 % (ref 0.0–3.0)
EOS ABS: 0.2 10*3/uL (ref 0.0–0.7)
EOS PCT: 1.9 % (ref 0.0–5.0)
HEMATOCRIT: 46.3 % (ref 39.0–52.0)
HEMOGLOBIN: 16 g/dL (ref 13.0–17.0)
Lymphocytes Relative: 16.7 % (ref 12.0–46.0)
Lymphs Abs: 1.7 10*3/uL (ref 0.7–4.0)
MCHC: 34.6 g/dL (ref 30.0–36.0)
MCV: 86.9 fl (ref 78.0–100.0)
MONOS PCT: 7 % (ref 3.0–12.0)
Monocytes Absolute: 0.7 10*3/uL (ref 0.1–1.0)
NEUTROS ABS: 7.6 10*3/uL (ref 1.4–7.7)
Neutrophils Relative %: 73.7 % (ref 43.0–77.0)
PLATELETS: 257 10*3/uL (ref 150.0–400.0)
RBC: 5.33 Mil/uL (ref 4.22–5.81)
RDW: 12.6 % (ref 11.5–15.5)
WBC: 10.4 10*3/uL (ref 4.0–10.5)

## 2017-06-19 LAB — SEDIMENTATION RATE: Sed Rate: 9 mm/hr (ref 0–15)

## 2017-06-19 NOTE — Patient Instructions (Signed)
Get  X ray  Chest     Flonase   And saline   And blood count and will let you know   .  Next plan   consider rx for    Resistant sinus infection.
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
## 2017-06-21 ENCOUNTER — Telehealth: Payer: Self-pay | Admitting: Internal Medicine

## 2017-06-21 MED ORDER — DOXYCYCLINE HYCLATE 100 MG PO TABS: 100.0000 mg | ORAL_TABLET | Freq: Two times a day (BID) | ORAL | 0 refills | Status: DC

## 2017-06-21 NOTE — Telephone Encounter (Signed)
Dr Fabian SharpPanosh is out of office this afternoon.  I have called and texted her for recommendations -- if not heard back by 4pm will send to another MD in office to advise.

## 2017-06-21 NOTE — Telephone Encounter (Signed)
Per Dr Fabian SharpPanosh, call in Doxy 100mg  BID x 1 week Call after weekend with update.   Pt aware

## 2017-06-21 NOTE — Telephone Encounter (Signed)
Copied from CRM [phone]#[id number]. Topic: Quick Communication - See Telephone Encounter >> Jun 21, 2017  1:17 PM Cipriano BunkerLambe, Annette S wrote: CRM for notification.   Pt. Is calling back, has not heard from anyone,  but he did get fever last night. Just not getting ready of it.    Question if calling in something for him?  Please call him and let him know  [facility] - [place] - [address] [place] [address] Phone: [phone] Fax: [phone]   See Telephone encounter for: 06/21/17.

## 2017-06-25 NOTE — Telephone Encounter (Signed)
Plan   Ov at end of medicine   Either Friday or next Monday Tuesday  Unless worse .

## 2017-06-25 NOTE — Telephone Encounter (Signed)
Pt states he was to report back to Dr Fabian SharpPanosh after the weekend. Pt states the stuffiness in his head is moving, and his head not hurting as bad. It (head) feels a lot better.  Pt still has a low grade fever, 99.6. That is what is bothering him the most. Pt reports not being able to stand for very long, gets weak and tired easily.

## 2017-06-25 NOTE — Telephone Encounter (Signed)
Patient notified scheduled for an app on 06/28/17 at 10:30

## 2017-06-28 ENCOUNTER — Ambulatory Visit: Payer: 59 | Admitting: Internal Medicine

## 2017-06-28 ENCOUNTER — Encounter: Payer: Self-pay | Admitting: Internal Medicine

## 2017-06-28 VITALS — BP 102/68 | HR 76 | Temp 98.0°F | Wt 179.8 lb

## 2017-06-28 DIAGNOSIS — J329 Chronic sinusitis, unspecified: Secondary | ICD-10-CM | POA: Diagnosis not present

## 2017-06-28 DIAGNOSIS — J989 Respiratory disorder, unspecified: Secondary | ICD-10-CM | POA: Diagnosis not present

## 2017-06-28 DIAGNOSIS — R509 Fever, unspecified: Secondary | ICD-10-CM | POA: Diagnosis not present

## 2017-06-28 MED ORDER — DOXYCYCLINE HYCLATE 100 MG PO TABS: 100.0000 mg | ORAL_TABLET | Freq: Two times a day (BID) | ORAL | 0 refills | Status: DC
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
## 2017-06-28 NOTE — Patient Instructions (Addendum)
Can take 3 more days of antibiotic .   If tolerated  If relapsing     Get back with us  Glad  you are feeling better   Advised .  Nasals steroids .  Such as flonase or nasacort  Every day    To prevent sinus infections   In the season for allergy .    Can use claritin  as needed

## 2017-12-28 ENCOUNTER — Emergency Department (HOSPITAL_COMMUNITY)
Admission: EM | Admit: 2017-12-28 | Discharge: 2017-12-28 | Disposition: A | Discharge status: Home / Self Care | Payer: 59 | Attending: Emergency Medicine | Admitting: Emergency Medicine

## 2017-12-28 ENCOUNTER — Encounter (HOSPITAL_COMMUNITY): Payer: Self-pay | Admitting: Emergency Medicine

## 2017-12-28 ENCOUNTER — Emergency Department (HOSPITAL_COMMUNITY): Payer: 59

## 2017-12-28 ENCOUNTER — Other Ambulatory Visit: Payer: Self-pay

## 2017-12-28 DIAGNOSIS — Z79899 Other long term (current) drug therapy: Secondary | ICD-10-CM | POA: Diagnosis not present

## 2017-12-28 DIAGNOSIS — X500XXA Overexertion from strenuous movement or load, initial encounter: Secondary | ICD-10-CM | POA: Diagnosis not present

## 2017-12-28 DIAGNOSIS — S93401A Sprain of unspecified ligament of right ankle, initial encounter: Secondary | ICD-10-CM | POA: Diagnosis not present

## 2017-12-28 DIAGNOSIS — Y998 Other external cause status: Secondary | ICD-10-CM | POA: Diagnosis not present

## 2017-12-28 DIAGNOSIS — S8991XA Unspecified injury of right lower leg, initial encounter: Secondary | ICD-10-CM | POA: Diagnosis present

## 2017-12-28 DIAGNOSIS — S8264XA Nondisplaced fracture of lateral malleolus of right fibula, initial encounter for closed fracture: Secondary | ICD-10-CM | POA: Diagnosis not present

## 2017-12-28 DIAGNOSIS — Y9364 Activity, baseball: Secondary | ICD-10-CM | POA: Diagnosis not present

## 2017-12-28 DIAGNOSIS — S82891A Other fracture of right lower leg, initial encounter for closed fracture: Secondary | ICD-10-CM | POA: Diagnosis not present

## 2017-12-28 DIAGNOSIS — Y92320 Baseball field as the place of occurrence of the external cause: Secondary | ICD-10-CM | POA: Insufficient documentation

## 2017-12-28 DIAGNOSIS — S8261XA Displaced fracture of lateral malleolus of right fibula, initial encounter for closed fracture: Secondary | ICD-10-CM | POA: Diagnosis not present

## 2017-12-28 NOTE — ED Provider Notes (Signed)
[facility] EMERGENCY DEPARTMENT Provider Note   CSN: [id number] Arrival date & time: 12/28/17  1515     History   Chief Complaint Chief Complaint  Patient presents with  . Ankle Pain    HPI Sergio Hampton is a 47 y.o. male presenting with complaint of acute onset of right ankle pain that began a couple of hours prior to arrival. He states he was playing baseball and was "trying to get an out." During this, he made a quick movement turning and caused his right ankle to invert. He has assoc swelling to lateral aspect with pain. Pain with weight bearing and ambulating, though he is able to ambulate. Took 400mg  ibuprofen around 1pm for pain. No other injuries.  The history is provided by the patient.    Past Medical History:  Diagnosis Date  . Acute appendicitis 03/20/2011  . Arthritis   . PONV (postoperative nausea and vomiting)   . Struck by horse 09/29/2012    Patient Active Problem List   Diagnosis Date Noted  . 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 Urinary dribbling 05/11/2010  . Knee pain 05/11/2010  . Visit for preventive health examination 05/09/2010    Past Surgical History:  Procedure Laterality Date  . COSMETIC SURGERY     on face  . CYSTOSCOPY    . LAPAROSCOPIC APPENDECTOMY  03/20/2011   Procedure: APPENDECTOMY LAPAROSCOPIC;  Surgeon: Adolph Pollack, MD;  Location: [facility];  Service: General;  Laterality: N/A;        Home Medications    Prior to Admission medications   Medication Sig Start Date End Date Taking? Authorizing Provider  Ascorbic Acid (VITAMIN C) 100 MG tablet Take 100 mg by mouth daily.    [provider]  doxycycline (VIBRA-TABS) 100 MG tablet Take 1 tablet (100 mg total) by mouth 2 (two) times daily. 06/28/17   Panosh, Neta Mends, MD    Family History Family History  Problem Relation Age of Onset  . Arthritis Mother   . Autoimmune disease Mother        of liver  . Atrial fibrillation Father   . Thyroid cancer  Father   . Lung cancer Father   . Asthma Brother     Social History Social History   Tobacco Use  . Smoking status: Never Smoker  . Smokeless tobacco: Never Used  Substance Use Topics  . Alcohol use: Yes    Comment: 1x/week  . Drug use: No     Allergies   Patient has no known allergies.   Review of Systems Review of Systems  Musculoskeletal: Positive for arthralgias and joint swelling.  Skin: Negative for wound.  Neurological: Negative for numbness.     Physical Exam Updated Vital Signs BP (!) 157/93 (BP Location: Right Arm)   Pulse 78   Temp 98.2 F (36.8 C) (Oral)   Resp 16   SpO2 100%   Physical Exam  Constitutional: He appears well-developed and well-nourished. No distress.  HENT:  Head: Normocephalic and atraumatic.  Eyes: Conjunctivae are normal.  Cardiovascular: Normal rate and intact distal pulses.  Pulmonary/Chest: Effort normal.  Musculoskeletal:  Right ankle with localized swelling surrounding lateral malleolus. Some ecchymosis present. TTP over lateral malleolus. Able to range in all directions though dorsiflexion and passive inversion causes pain. NV intact. No wounds  Psychiatric: He has a normal mood and affect. His behavior is normal.  Nursing note and vitals reviewed.    ED Treatments / Results  Labs (all labs ordered are listed, but only abnormal results are displayed) Labs Reviewed - No data to display  EKG None  Radiology Dg Ankle Complete Right  Result Date: 12/28/2017 CLINICAL DATA:  Twisting injury.  Ankle pain, swelling EXAM: RIGHT ANKLE - COMPLETE 3+ VIEW COMPARISON:  None. FINDINGS: Lateral soft tissue swelling. Small avulsed fragment between the tip of the lateral malleolus and the lateral talus. Large joint effusion noted. IMPRESSION: Small avulsed fragment from the lateral malleolus or lateral talus. Lateral soft tissue swelling. Electronically Signed   By: Charlett Nose M.D.   On: 12/28/2017 16:01     Procedures Procedures (including critical care time)  Medications Ordered in ED Medications - No data to display   Initial Impression / Assessment and Plan / ED Course  I have reviewed the triage vital signs and the nursing notes.  Pertinent labs & imaging results that were available during my care of the patient were reviewed by me and considered in my medical decision making (see chart for details).     Patient with right ankle injury after rolling it today while playing baseball.  Associated swelling surrounding lateral malleolus.  Ankle is stable.  Neurovascularly intact.  X-ray with small avulsion fracture. Likely sprain given avulsion fracture and mechanism of injury.  ASO brace applied, crutches.  RICE therapy, NSAIDs, orthopedic referral for follow-up.  Safe for discharge.  Discussed results, findings, treatment and follow up. Patient advised of return precautions. Patient verbalized understanding and agreed with plan.   Final Clinical Impressions(s) / ED Diagnoses   Final diagnoses:  Sprain of right ankle, unspecified ligament, initial encounter  Avulsion fracture of ankle, right, closed, initial encounter    ED Discharge Orders    None       Valisha Heslin, [place] N, PA-C 12/28/17 1632    Sabas Sous, MD 12/28/17 2241

## 2017-12-28 NOTE — ED Triage Notes (Signed)
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 Pt took motrin at 1300. Ankle visibly swollen, pt able to bear weight with pain.

## 2017-12-28 NOTE — Discharge Instructions (Signed)
Please read instructions below. Apply ice to your ankle for 20 minutes at a time.  Elevate it as much as possible. You can take 600 mg of ibuprofen every 6 hours as needed for pain. Schedule an appointment with the orthopedic specialist in 1 week for follow-up on your injury. Avoid weightbearing until you have been evaluated by the specialist. Return to the ER for new or concerning symptoms.

## 2017-12-31 DIAGNOSIS — S8264XA Nondisplaced fracture of lateral malleolus of right fibula, initial encounter for closed fracture: Secondary | ICD-10-CM | POA: Diagnosis not present

## 2018-01-27 ENCOUNTER — Ambulatory Visit (INDEPENDENT_AMBULATORY_CARE_PROVIDER_SITE_OTHER): Payer: 59

## 2018-01-27 DIAGNOSIS — Z23 Encounter for immunization: Secondary | ICD-10-CM | POA: Diagnosis not present

## 2019-02-03 ENCOUNTER — Other Ambulatory Visit: Payer: Self-pay

## 2019-02-03 ENCOUNTER — Ambulatory Visit (INDEPENDENT_AMBULATORY_CARE_PROVIDER_SITE_OTHER): Payer: 59

## 2019-02-03 DIAGNOSIS — Z23 Encounter for immunization: Secondary | ICD-10-CM | POA: Diagnosis not present

## 2019-04-13 IMAGING — CR DG ANKLE COMPLETE 3+V*R*
3 series · 3 of 3 positions shown · non-contrast
Comparison: None.

CLINICAL DATA: Twisting injury.  Ankle pain, swelling

EXAM:
RIGHT ANKLE - COMPLETE 3+ VIEW

[ankle ap]
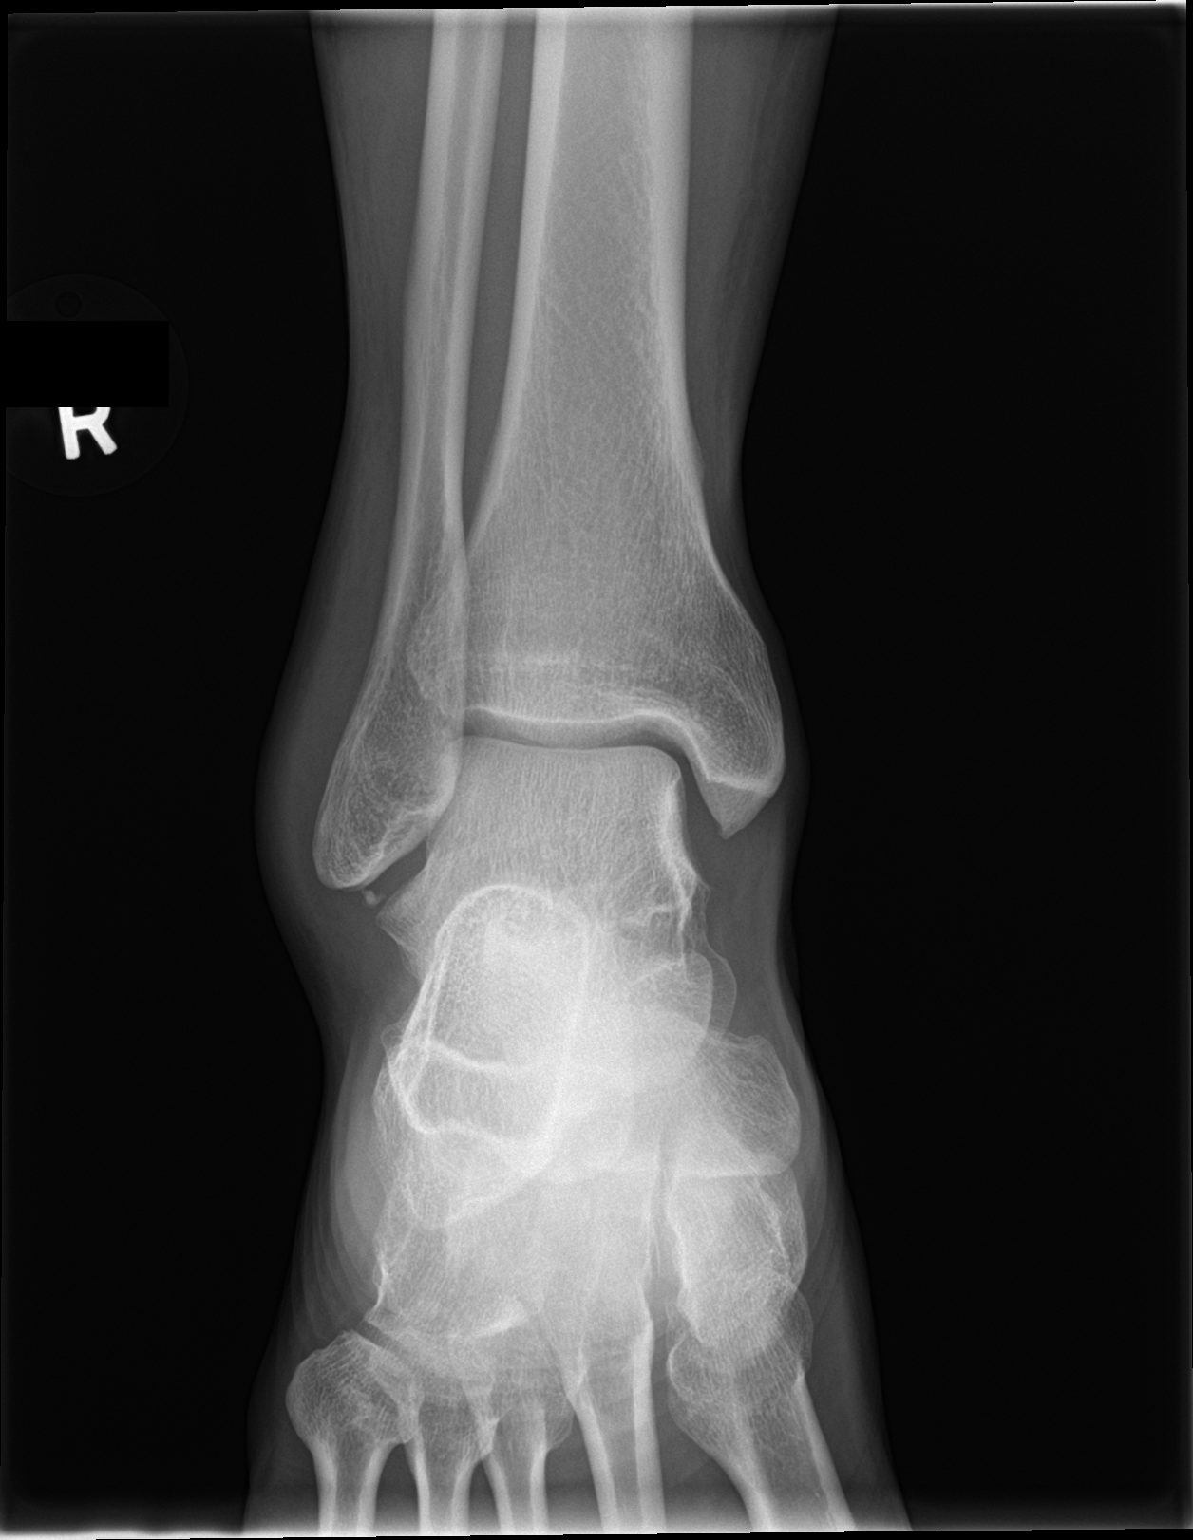

[ankle obl]
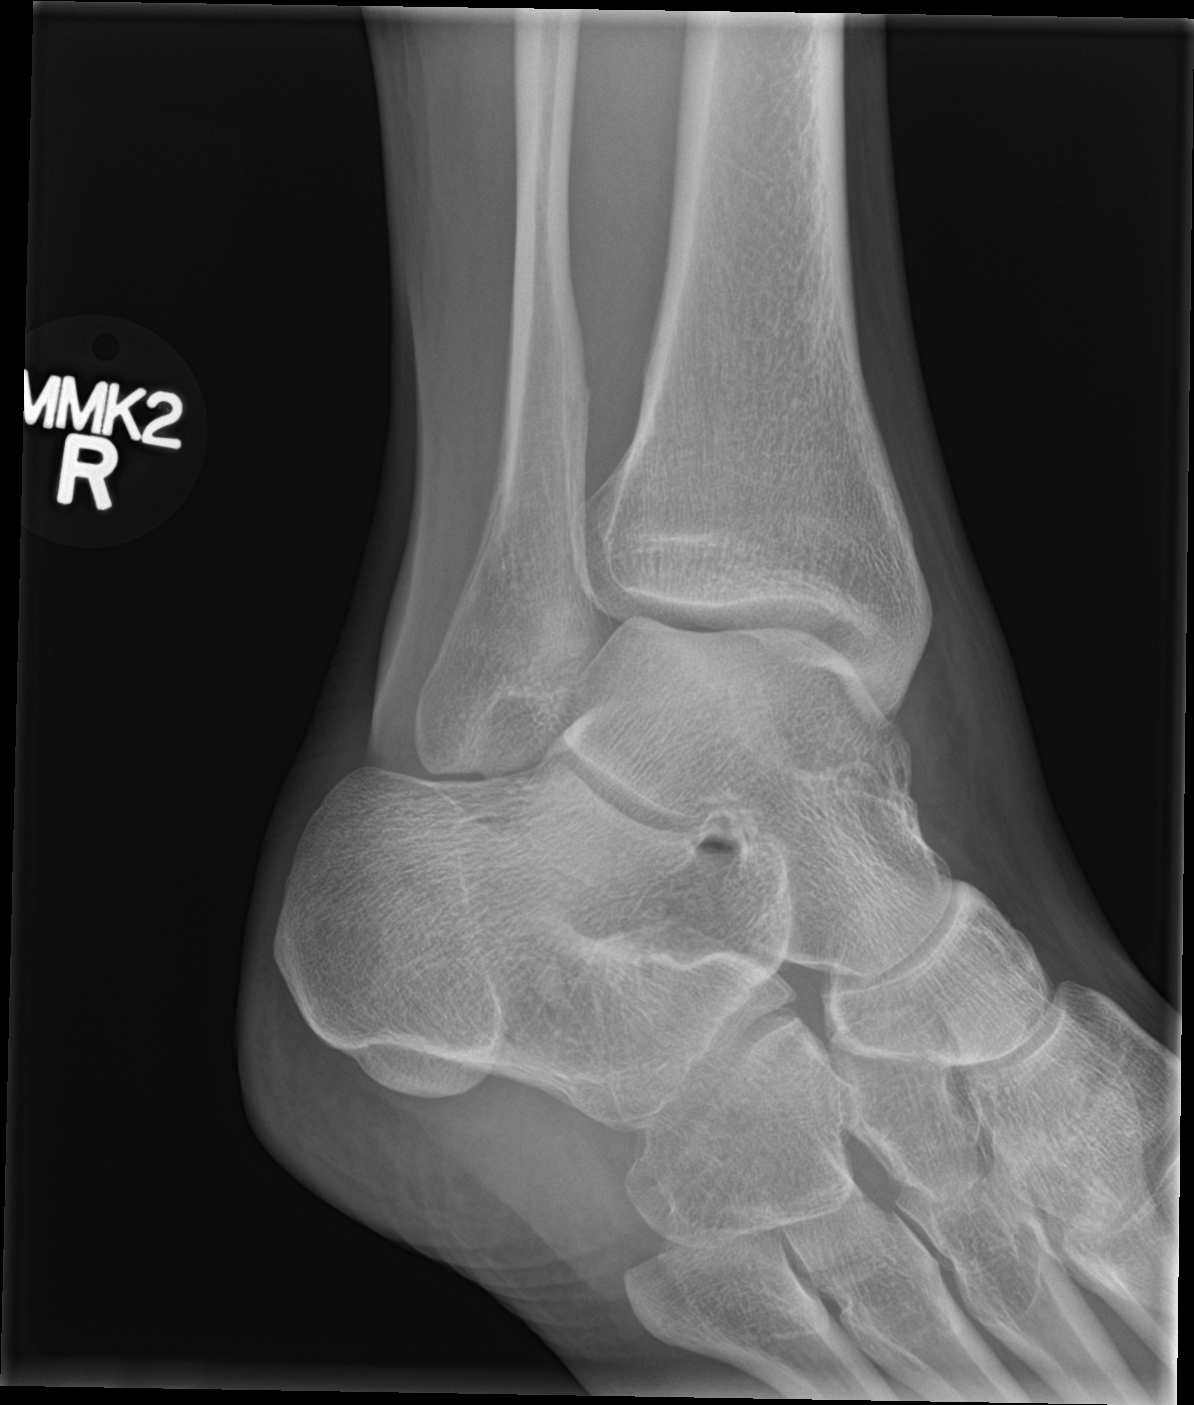

[ankle lat]
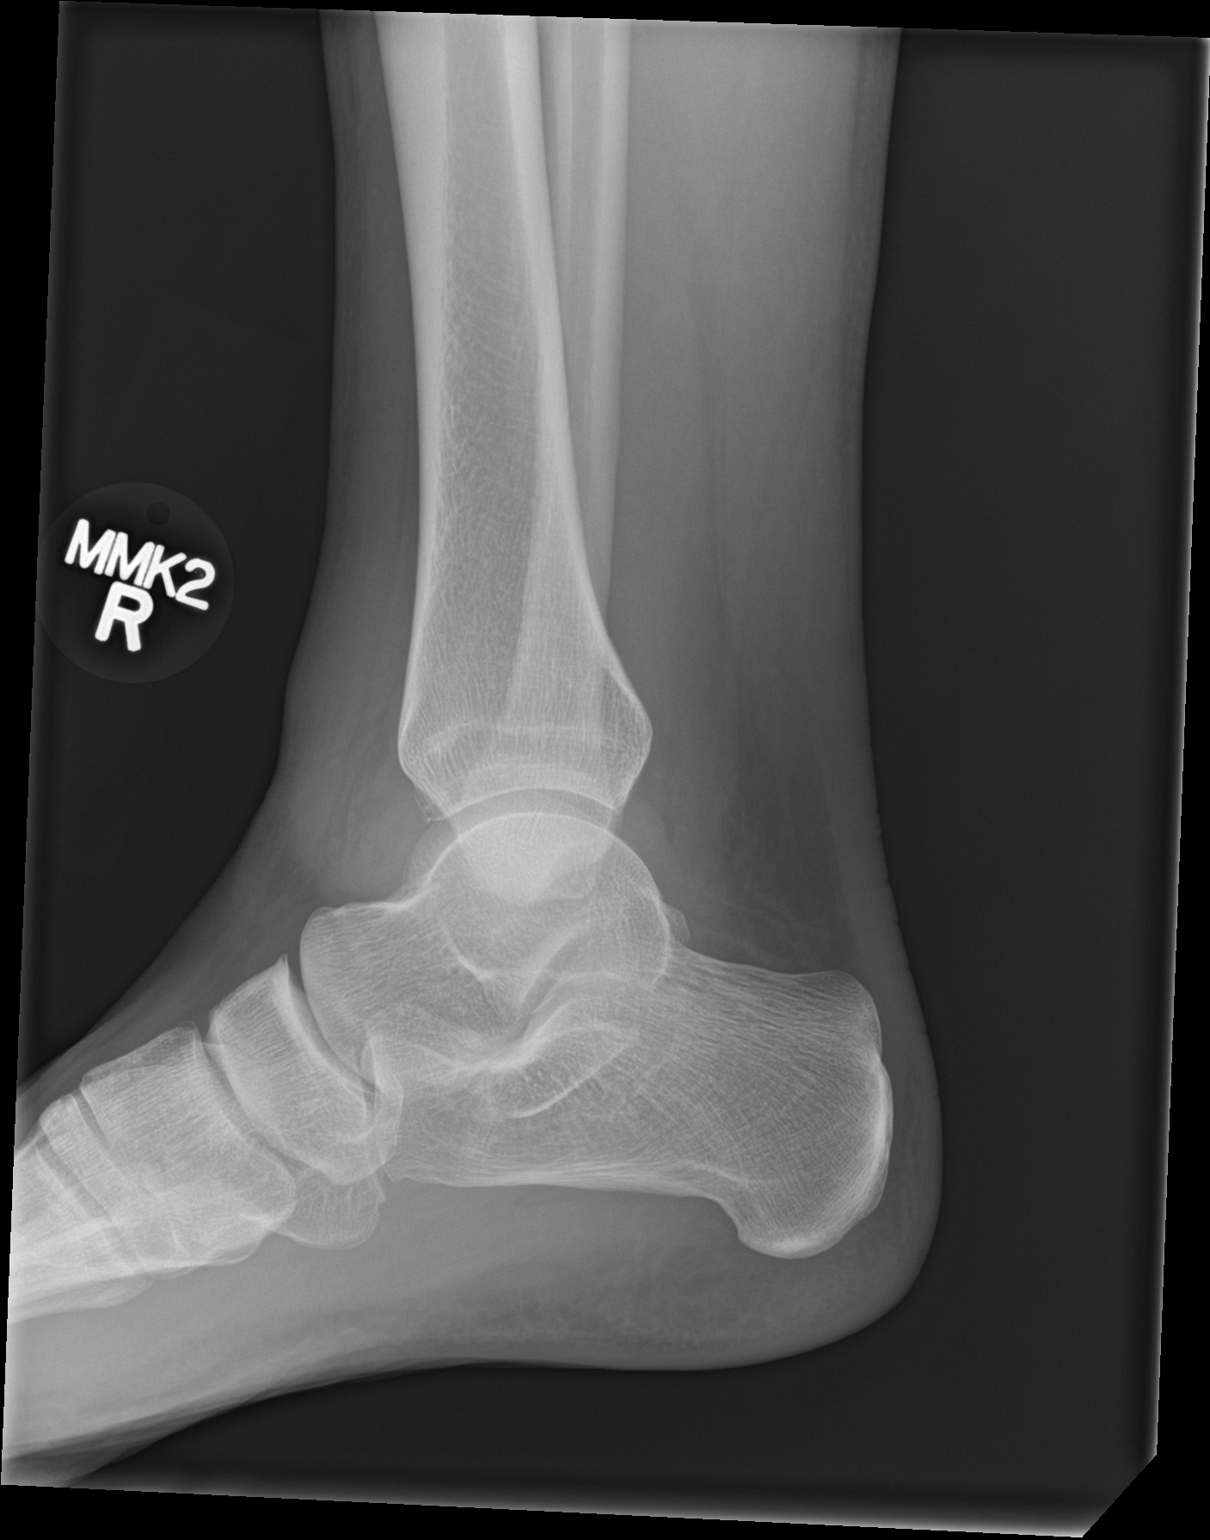

[3 of 3 positions shown; findings below may reference images not displayed]

FINDINGS: Lateral soft tissue swelling. Small avulsed fragment between the tip
of the lateral malleolus and the lateral talus. Large joint effusion
noted.
IMPRESSION: Small avulsed fragment from the lateral malleolus or lateral talus.
Lateral soft tissue swelling.

## 2019-04-27 ENCOUNTER — Encounter: Payer: Self-pay | Admitting: Internal Medicine

## 2019-04-27 ENCOUNTER — Ambulatory Visit: Payer: 59 | Attending: Internal Medicine

## 2019-04-27 ENCOUNTER — Telehealth (INDEPENDENT_AMBULATORY_CARE_PROVIDER_SITE_OTHER): Payer: 59 | Admitting: Internal Medicine

## 2019-04-27 ENCOUNTER — Other Ambulatory Visit: Payer: Self-pay

## 2019-04-27 ENCOUNTER — Other Ambulatory Visit: Payer: 59

## 2019-04-27 DIAGNOSIS — J989 Respiratory disorder, unspecified: Secondary | ICD-10-CM

## 2019-04-27 DIAGNOSIS — R509 Fever, unspecified: Secondary | ICD-10-CM

## 2019-04-27 DIAGNOSIS — Z20822 Contact with and (suspected) exposure to covid-19: Secondary | ICD-10-CM

## 2019-04-27 NOTE — Progress Notes (Signed)
Virtual Visit via Video Note  I connected with@ on 04/27/19 at 10:00 AM EST by a video enabled telemedicine application and verified that I am speaking with the correct person using two identifiers. Location patient: home Location provider:work office Persons participating in the virtual visit: patient, provider  [place] national recommendations  regarding COVID 19 pandemic   video visit is advised over in office visit for this patient.  Patient aware  of the limitations of evaluation and management by telemedicine and  availability of in person appointments. and agreed to proceed.   HPI: Sergio Hampton presents for video visit Onset of Jan 29 of head congestion and headache behind eyes   w  Minimal cough if at all no sob  And nasal dranage  feesl tried and  Weak b temp 100.3 todat  toodk otc allergy dolc med and is on  His floanse  . Had flu vaccine this  Season.   Wife with low grade temp a nd headache sinus sx  Around the same time / a few days earlier .  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 No cp sob gi rash taste or smell distortion.   Past Medical History:  Diagnosis Date  . Acute appendicitis 03/20/2011  . Arthritis   . PONV (postoperative nausea and vomiting)   . Struck by horse 09/29/2012    Past Surgical History:  Procedure Laterality Date  . COSMETIC SURGERY     on face  . CYSTOSCOPY    . LAPAROSCOPIC APPENDECTOMY  03/20/2011   Procedure: APPENDECTOMY LAPAROSCOPIC;  Surgeon: Adolph Pollack, MD;  Location: [facility];  Service: General;  Laterality: N/A;    Family History  Problem Relation Age of Onset  . Arthritis Mother   . Autoimmune disease Mother        of liver  . Atrial fibrillation Father   . Thyroid cancer Father   . Lung cancer Father   . Asthma Brother     Social History   Tobacco Use  . Smoking status: Never Smoker  . Smokeless tobacco:  Never Used  Substance Use Topics  . Alcohol use: Yes    Comment: 1x/week  . Drug use: No      Current Outpatient Medications:  .  Ascorbic Acid (VITAMIN C) 100 MG tablet, Take 100 mg by mouth daily., Disp: , Rfl:  .  doxycycline (VIBRA-TABS) 100 MG tablet, Take 1 tablet (100 mg total) by mouth 2 (two) times daily., Disp: 6 tablet, Rfl: 0  EXAM: BP Readings from Last 3 Encounters:  12/28/17 (!) 157/93  06/28/17 102/68  06/19/17 (!) 142/82    VITALS per patient if applicable: non toxic looks tired under the weather no sob cough   GENERAL: alert, oriented, appears well and in no acute distress  HEENT: atraumatic, conjunttiva clear, no obvious abnormalities on inspection of external nose and ears  NECK: normal movements of the head and neck  LUNGS: on inspection no signs of respiratory distress, breathing rate appears normal, no obvious gross SOB, gasping or wheezing  CV: no obvious cyanosis  MS: moves all visible extremities without noticeable abnormality  PSYCH/NEURO: pleasant and cooperative, no obvious depression or anxiety, speech and thought processing grossly intact  ASSESSMENT AND PLAN:  Discussed the following assessment and plan:    ICD-10-CM   1. Febrile respiratory illness  J98.9    R50.9    viral wife w simoilar sx high [phone] in community ;test isolate supportive carecheck with [place] if fever  persist   Viral illness  Advise sarscov2 testing  Counseled.  Alarm sx disc when to seek emergent care  . CDC guidelines for isolation  And if neg covid  Fever must be gone 24 - 48 hours and feeling better    Expectant management and discussion of plan and treatment with opportunity to ask questions and all were answered. The patient agreed with the plan and demonstrated an understanding of the instructions.   Advised to call back or seek an in-person evaluation if worsening  or having  further concerns . Return if symptoms worsen or fail to improve, for depending on  results.  Shanon Ace, MD

## 2019-04-28 LAB — NOVEL CORONAVIRUS, NAA: SARS-CoV-2, NAA: DETECTED — AB

## 2019-05-04 ENCOUNTER — Encounter: Payer: Self-pay | Admitting: Internal Medicine

## 2019-05-04 ENCOUNTER — Telehealth: Payer: Self-pay | Admitting: Internal Medicine

## 2019-05-04 DIAGNOSIS — U071 COVID-19: Secondary | ICD-10-CM

## 2019-05-04 DIAGNOSIS — J988 Other specified respiratory disorders: Secondary | ICD-10-CM

## 2019-05-04 NOTE — Telephone Encounter (Signed)
Patient seen in video visit with his wife Judeth Cornfield.  Documented as a telephone call  They both were diagnosed respiratory illness with Covid 19 SARS-CoV-2 February 1 or 2. His symptoms occurred a few days after his wife's and she is on day 11 of symptoms. They have both been hanging out mostly in bed she is starting to turn the corner and getting better but has a bad cough. However he does not feel he is getting better but not worse having persistent fever no matter with Tylenol or ibuprofen today he took his ear temperature and it was 101.2. He also describes what he calls a chest tightness that comes up from his stomach burping helps but then he describes squeezing like a belt around his chest but is not really short of breath and does not have a major cough.  No vomiting or diarrhea just feels very tired and just hanging out in bed stay hydrated His wife is sick and his son 47 years old is positive for Covid but is having back very minor symptoms.  Discussed with him alarm symptoms concerned that he could be getting COVID-19pneumonia)  With persistent fever and his symptoms although does not look toxic at this time. Advised being seen in the respiratory clinic tomorrow evening because today neither feels like they want to drive.  Discussed alarm symptoms and when to seek emergent care.

## 2019-05-05 ENCOUNTER — Ambulatory Visit: Payer: Self-pay

## 2019-05-06 ENCOUNTER — Ambulatory Visit (INDEPENDENT_AMBULATORY_CARE_PROVIDER_SITE_OTHER): Payer: 59 | Admitting: Internal Medicine

## 2019-05-06 DIAGNOSIS — U071 COVID-19: Secondary | ICD-10-CM | POA: Diagnosis not present

## 2019-05-06 MED ORDER — AZITHROMYCIN 250 MG PO TABS: ORAL_TABLET | ORAL | 0 refills | Status: DC

## 2019-05-06 MED ORDER — DEXAMETHASONE 6 MG PO TABS: 6.0000 mg | ORAL_TABLET | Freq: Every day | ORAL | 0 refills | Status: DC

## 2019-05-06 NOTE — Patient Instructions (Signed)
Please pick up the Z-Pak and the Decadron.  Take Zicam lozenges 5 times a day for 5 days.

## 2019-05-06 NOTE — Progress Notes (Signed)
Respiratory Clinic Note   Patient: Sergio Hampton Male    DOB: 05-08-1970   49 y.o.   MRN: [id number] Visit Date: 05/06/2019  Today's Provider: Pyatt   CC: persistent COVID symptoms  Subjective:    Covid-19 Nucleic Acid Test Results Lab Results  Component Value Date   SARSCOV2NAA Detected (A) 04/27/2019    HPI he has been sick for 9 days and tested + 04/27/2019.  His wife also has been sick approximately 15 days and also tested positive.  She is on no treatment.   His major symptom is temperature up to 101.4.  He has a cough with scant sputum.  The cough is also associated with fatigue & some exertional dyspnea. The temperature has ben associated with  diffuse headache.  He vomited on Tuesday 2/2 and again on Saturday morning 2/6.  He has been taking Tylenol and ibuprofen.  He describes slight bilateral anterior pressure in the inferior thorax but no frank chest pain.  This is better with Tylenol.  Overall he feels better as the temp appears to be resolving.  Past medical history is essentially noncontributory in reference to his Covid infection.  Specifically there are no significant comorbidities present.  He has never smoked.   Current Outpatient Medications:  .  Ascorbic Acid (VITAMIN C) 100 MG tablet, Take 100 mg by mouth daily., Disp: , Rfl:  .  doxycycline (VIBRA-TABS) 100 MG tablet, Take 1 tablet (100 mg total) by mouth 2 (two) times daily. (Patient not taking: Reported on 05/06/2019), Disp: 6 tablet, Rfl: 0  No Known Allergies  Review of Systems  Positive review of systems for Covid infection are documented above in HPI. Not present are: HEENT: Eye redness and discharge (conjunctivitis), nasal congestion, sore throat, anosmia, and altered taste Pulmonary: tachypnea, tachycardia, hemoptysis GI:  diarrhea Genitourinary: Oliguria or anuria Skeletal: Myalgias Dermatologic: Rash Neurologic:  dizziness, mental status changes    Objective:   BP 136/78    Pulse 80   Temp 98.8 F (37.1 C)   Wt 180 lb (81.6 kg)   SpO2 95%   BMI 26.58 kg/m   Physical Exam Pertinent or positive findings:he appears fatigued.  He has low-grade rales at the bases.  General appearance: Adequately nourished; no acute distress, increased work of breathing is present.   Lymphatic: No lymphadenopathy about the head, neck, axilla. Eyes: No conjunctival inflammation or lid edema is present. There is no scleral icterus. Ears:  External ear exam shows no significant lesions or deformities.   Nose:  External nasal examination shows no deformity or inflammation. Nasal mucosa are pink and moist without lesions, exudates Oral exam:  Lips and gums are healthy appearing. There is no oropharyngeal erythema or exudate. Neck:  No thyromegaly, masses, tenderness noted.    Heart:  Normal rate and regular rhythm. S1 and S2 normal without gallop, murmur, click, rub .  Lungs:  without wheezes, rhonchi, rubs. Abdomen: Bowel sounds are normal. Abdomen is soft and nontender with no organomegaly, hernias, masses. GU: Deferred  Extremities:  No cyanosis, clubbing, edema  Skin: Warm & dry w/o tenting. No significant lesions or rash.  No results found for any visits on 05/06/19.    Assessment & Plan    #1 Covid infection; screening test + 04/27/2019.  His most significant symptom has been temp up to 101.4; this appears to be resolving.  On exam he does have bibasilar rales. Plan: As symptoms been present for 9 days and rales present on exam;  as per the [facility] protocol Zicam, doxycycline, Decadron will be initiated short term.     [facility] RESPIRATORY CLINIC   [facility]

## 2019-05-08 DIAGNOSIS — U071 COVID-19: Secondary | ICD-10-CM | POA: Insufficient documentation

## 2019-05-08 NOTE — Assessment & Plan Note (Signed)
Covid screening + 04/27/2019 Major symptom temp up to 101.4, but subjectively resolving Minor bibasilar rales on exam without hypoxia [facility] protocol initiated with doxycycline, dexamethasone, and zinc.  Monoclonal antibodies not ordered as symptoms present at least 9 days plus

## 2019-05-22 ENCOUNTER — Encounter: Payer: Self-pay | Admitting: Internal Medicine

## 2019-05-22 ENCOUNTER — Other Ambulatory Visit: Payer: Self-pay

## 2019-05-22 ENCOUNTER — Telehealth (INDEPENDENT_AMBULATORY_CARE_PROVIDER_SITE_OTHER): Payer: 59 | Admitting: Internal Medicine

## 2019-05-22 DIAGNOSIS — R21 Rash and other nonspecific skin eruption: Secondary | ICD-10-CM

## 2019-05-22 DIAGNOSIS — Z8616 Personal history of COVID-19: Secondary | ICD-10-CM

## 2019-05-22 NOTE — Progress Notes (Signed)
Virtual Visit via Video Note  I connected with@ on 05/22/19 at  2:00 PM EST by a video enabled telemedicine application and verified that I am speaking with the correct person using two identifiers. Location patient: home Location provider:work  office Persons participating in the virtual visit: patient, provider  WIth national recommendations  regarding COVID 19 pandemic   video visit is advised over in office visit for this patient.  Patient aware  of the limitations of evaluation and management by telemedicine and  availability of in person appointments. and agreed to proceed.   HPI: Sergio Hampton presents for video visitSaw  Dr Linna Darner in rep clinic on 2 10  Placed ohn dexa and z pack Since then  His doing much better no cp sob fever  Slight head ache at times  Back to work  Not  very physical  Noted a rash  Trunk chest and torso and back  Since Feb 21 and continuing   Mild itching after bathing but not severe.  No meds  No topicals new  .  Of note his wife with covid and a short lived rash on chest ? Better   ROS: See pertinent positives and negatives per HPI. No fever hives  resp sx   Past Medical History:  Diagnosis Date  . Acute appendicitis 03/20/2011  . Arthritis   . PONV (postoperative nausea and vomiting)   . Struck by horse 09/29/2012    Past Surgical History:  Procedure Laterality Date  . COSMETIC SURGERY     on face  . CYSTOSCOPY    . LAPAROSCOPIC APPENDECTOMY  03/20/2011   Procedure: APPENDECTOMY LAPAROSCOPIC;  Surgeon: Odis Hollingshead, MD;  Location: [facility] ORS;  Service: General;  Laterality: N/A;    Family History  Problem Relation Age of Onset  . Arthritis Mother   . Autoimmune disease Mother        of liver  . Atrial fibrillation Father   . Thyroid cancer Father   . Lung cancer Father   . Asthma Brother     Social History   Tobacco Use  . Smoking status: Never Smoker  . Smokeless tobacco: Never Used  Substance Use Topics  . Alcohol use: Yes     Comment: 1x/week  . Drug use: No      Current Outpatient Medications:  .  Ascorbic Acid (VITAMIN C) 100 MG tablet, Take 100 mg by mouth daily., Disp: , Rfl:  .  azithromycin (ZITHROMAX) 250 MG tablet, Day 1 take 2 tablets, then take 1 tablet daily for 4 days, Disp: 6 tablet, Rfl: 0 .  dexamethasone (DECADRON) 6 MG tablet, Take 1 tablet (6 mg total) by mouth daily., Disp: 5 tablet, Rfl: 0 .  doxycycline (VIBRA-TABS) 100 MG tablet, Take 1 tablet (100 mg total) by mouth 2 (two) times daily. (Patient not taking: Reported on 05/06/2019), Disp: 6 tablet, Rfl: 0  EXAM: BP Readings from Last 3 Encounters:  05/06/19 136/78  12/28/17 (!) 157/93  06/28/17 102/68    VITALS per patient if applicable:  GENERAL: alert, oriented, appears well and in no acute distress  HEENT: atraumatic, conjunttiva clear, no obvious abnormalities on inspection of external nose and ears Skin    Left trunk looks discrete  Small bumps  Cannot tell if vesicular but not very red  And no  Dermatome distribution  Or linear   LUNGS: on inspection no signs of respiratory distress, breathing rate appears normal, no obvious gross SOB, gasping or wheezing  CV:  no obvious cyanosis  MS: moves all visible extremities without noticeable abnormality  PSYCH/NEURO: pleasant and cooperative, no obvious depression or anxiety, speech and thought processing grossly intact   ASSESSMENT AND PLAN:  Discussed the following assessment and plan:    ICD-10-CM   1. Rash  R21   2. Personal history of covid-19  Z86.16    Suspect could be post covid related  Based in description and reassured not shingles . Looks follicular or as he states like chicken pox small min itching x when gets in shower   distribution is truncal    No meds except can try small area of  hcs   Take a pix and if not fading in a week then  In person visit to check  Counseled.   Expectant management and discussion of plan and treatment with opportunity to ask  questions and all were answered. The patient agreed with the plan and demonstrated an understanding of the instructions.   Advised to call back or seek an in-person evaluation if worsening  or having  further concerns . Return for in person  if not fading in a week or so .    Berniece Andreas, MD  UTD   excerpt "Vesicular (varicella-like) eruptions - There are several reports describing a vesicular-pustular, varicella-like eruption associated with COVID-19 [2,16,44,45]. In a series of 24 patients, an eruption of small papules, vesicles, and pustules appeared 4 to 30 days after the onset of COVID symptoms and resolved in a median of 10 days [46]. A real-time PCR for SARS-CoV-2 from vesicle content performed in four patients yielded negative results. Seventeen of 24 patients were not taking any medications, ruling out a drug reaction."

## 2020-08-01 NOTE — Progress Notes (Signed)
Chief Complaint  Patient presents with  . Annual Exam    HPI: Patient  Sergio Hampton  50 y.o. comes in today for Preventive Health Care visit also has a form for [employer]. Been pretty well no specific complaints. Knee reading glasses gets eyes checked no major injuries no cardiovascular or pulmonary symptoms. Negative family history first-degree of colon cancer in bowel habits Health Maintenance  Topic Date Due  . HIV Screening  Never done  . Hepatitis C Screening  Never done  . COLONOSCOPY (Pts 45-68yrs Insurance coverage will need to be confirmed)  Never done  . COVID-19 Vaccine (1) 08/19/2020 (Originally 07/09/1975)  . INFLUENZA VACCINE  10/24/2020  . TETANUS/TDAP  08/04/2030  . HPV VACCINES  Aged Out   Health Maintenance Review LIFESTYLE:  Exercise:   Moving  But not as much as wants. 10 minuies. ative .  Tobacco/ETS: n Alcohol: n Sugar beverages: some sweet tea  5 x per week .  Sleep:  7 hours+  Drug use: no HH of   3  Out side pets  Work:   Too many .  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    ROS:  GREST of 12 system review negative except as per HPI   Past Medical History:  Diagnosis Date  . Acute appendicitis 03/20/2011  . Arthritis   . PONV (postoperative nausea and vomiting)   . Struck by horse 09/29/2012    Past Surgical History:  Procedure Laterality Date  . COSMETIC SURGERY     on face  . CYSTOSCOPY    . LAPAROSCOPIC APPENDECTOMY  03/20/2011   Procedure: APPENDECTOMY LAPAROSCOPIC;  Surgeon: Adolph Pollack, MD;  Location: [facility];  Service: General;  Laterality: N/A;    Family History  Problem Relation Age of Onset  . Arthritis Mother   . Autoimmune disease Mother        of liver  . Atrial fibrillation Father   . Thyroid cancer Father   . Lung cancer Father   . Asthma Brother     Social History   Socioeconomic History  . Marital status: Married    Spouse name: Not on file  . Number of children: Not on file  . Years of  education: Not on file  . Highest education level: Not on file  Occupational History  . Not on file  Tobacco Use  . Smoking status: Never Smoker  . Smokeless tobacco: Never Used  Substance and Sexual Activity  . Alcohol use: Yes    Comment: 1x/week  . Drug use: No  . Sexual activity: Not on file  Other Topics Concern  . Not on file  Social History Narrative   HHof 3 wife son pet dog.  Outside  And horses.  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    Neg ETS Firearms        HS education               Social Determinants of Health   Financial Resource Strain: Not on file  Food Insecurity: Not on file  Transportation Needs: Not on file  Physical Activity: Not on file  Stress: Not on file  Social Connections: Not on file    Outpatient Medications Prior to Visit  Medication Sig Dispense Refill  . Ascorbic Acid (VITAMIN C) 100 MG tablet Take 100  mg by mouth daily.    Marland Kitchen. dexamethasone (DECADRON) 6 MG tablet Take 1 tablet (6 mg total) by mouth daily. 5 tablet 0  . fluticasone (FLONASE) 50 MCG/ACT nasal spray Place into both nostrils daily.    Marland Kitchen. azithromycin (ZITHROMAX) 250 MG tablet Day 1 take 2 tablets, then take 1 tablet daily for 4 days 6 tablet 0  . doxycycline (VIBRA-TABS) 100 MG tablet Take 1 tablet (100 mg total) by mouth 2 (two) times daily. 6 tablet 0   No facility-administered medications prior to visit.     EXAM:  BP 116/80 (BP Location: Left Arm, Patient Position: Sitting, Cuff Size: Normal)   Pulse 71   Temp 97.9 F (36.6 C) (Oral)   Ht 5' 9.5" (1.765 m)   Wt 186 lb 8 oz (84.6 kg)   SpO2 98%   BMI 27.15 kg/m   Body mass index is 27.15 kg/m. Wt Readings from Last 3 Encounters:  08/03/20 186 lb 8 oz (84.6 kg)  05/06/19 180 lb (81.6 kg)  06/28/17 179 lb 12.8 oz (81.6 kg)    Physical Exam: Vital signs reviewed WUJ:WJXBGEN:This is a well-developed well-nourished alert cooperative    who  appearsr stated age in no acute distress.  HEENT: normocephalic atraumatic , Eyes: PERRL EOM's full, conjunctiva clear, ., Ears: no deformity EAC's clear TMs with normal landmarks. Mouth: Masked NECK: supple without masses, thyromegaly or bruits. CHEST/PULM:  Clear to auscultation and percussion breath sounds equal no wheeze , rales or rhonchi.  CV: PMI is nondisplaced, S1 S2 no gallops, murmurs, rubs. Peripheral pulses are full without delay.No JVD .  ABDOMEN: Bowel sounds normal nontender  No guard or rebound, no hepato splenomegal no CVA tenderness.  . Extremtities:  No clubbing cyanosis or edema, no acute joint swelling or redness no focal atrophy NEURO:  Oriented x3, cranial nerves 3-12 appear to be intact, no obvious focal weakness,gait within normal limits no abnormal reflexes or asymmetrical SKIN: No acute rashes normal turgor, color, no bruising or petechiae. PSYCH: Oriented, good eye contact, no obvious depression anxiety, cognition and judgment appear normal. LN: no cervical axillary inguinal adenopathy  Lab Results  Component Value Date   WBC 10.4 06/19/2017   HGB 16.0 06/19/2017   HCT 46.3 06/19/2017   PLT 257.0 06/19/2017   GLUCOSE 96 05/31/2015   CHOL 169 05/31/2015   TRIG 155.0 (H) 05/31/2015   HDL 35.80 (L) 05/31/2015   LDLCALC 102 (H) 05/31/2015   ALT 30 05/31/2015   AST 23 05/31/2015   NA 143 05/31/2015   K 4.6 05/31/2015   CL 105 05/31/2015   CREATININE 1.17 05/31/2015   BUN 17 05/31/2015   CO2 27 05/31/2015   TSH 2.16 05/31/2015    BP Readings from Last 3 Encounters:  08/03/20 116/80  05/06/19 136/78  12/28/17 (!) 157/93    Lab plan reviewed with patient   ASSESSMENT AND PLAN:  Discussed the following assessment and plan:    ICD-10-CM   1. Visit for preventive health examination  Z00.00 Basic metabolic panel    CBC with Differential/Platelet    Hepatic function panel    Lipid panel    TSH    PSA    PSA    TSH    Lipid panel    Hepatic  function panel    CBC with Differential/Platelet    Basic metabolic panel  2. 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 Personal history of COVID-19  Z86.16 Basic metabolic panel    CBC with Differential/Platelet    Hepatic function panel    Lipid panel    TSH    TSH    Lipid panel    Hepatic function panel    CBC with Differential/Platelet    Basic metabolic panel   2 2021  4. Need for hepatitis C screening test  Z11.59 Hepatitis C antibody    Hepatitis C antibody  5. Need for Td vaccine  Z23 Td : Tetanus/diphtheria >7yo Preservative  free  6. Colon cancer screening  Z12.11 Ambulatory referral to Gastroenterology  Healthy form completed updated no restriction. Conversation about colon cancer screening will refer for colonoscopy.  Return in about 1 year (around 08/03/2021) for depending on results.  Patient Care Team: Algenis Ballin, Neta Mends, MD as PCP - General (Internal Medicine) Patient Instructions   Attention to healthy lifestyle make sure you get adequate sleep. We will do referral for colonoscopy routine colon cancer screening Tetanus shot update today.     Health Maintenance, Male Adopting a healthy lifestyle and getting preventive care are important in promoting health and wellness. Ask your health care provider about:  The right schedule for you to have regular tests and exams.  Things you can do on your own to prevent diseases and keep yourself healthy. What should I know about diet, weight, and exercise? Eat a healthy diet  Eat a diet that includes plenty of vegetables, fruits, low-fat dairy products, and lean protein.  Do not eat a lot of foods that are high in solid fats, added sugars, or sodium.   Maintain a healthy weight Body mass index (BMI) is a  measurement that can be used to identify possible weight problems. It estimates body fat based on height and weight. Your health care provider can help determine your BMI and help you achieve or maintain a healthy weight. Get regular exercise Get regular exercise. This is one of the most important things you can do for your health. Most adults should:  Exercise for at least 150 minutes each week. The exercise should increase your heart rate and make you sweat (moderate-intensity exercise).  Do strengthening exercises at least twice a week. This is in addition to the moderate-intensity exercise.  Spend less time sitting. Even light physical activity can be beneficial. Watch cholesterol and blood lipids Have your blood tested for lipids and cholesterol at 49 years of age, then have this test every 5 years. You may need to have your cholesterol levels checked more often if:  Your lipid or cholesterol levels are high.  You are older than 49 years of age.  You are at high risk for heart disease. What should I know about cancer screening? Many types of cancers can be detected early and may often be prevented. Depending on your health history and family history, you may need to have cancer screening at various ages. This may include screening for:  Colorectal cancer.  Prostate cancer.  Skin cancer.  Lung cancer. What should I know about heart disease, diabetes, and high blood pressure? Blood pressure and heart disease  High blood pressure causes heart disease and increases the risk of stroke. This is more likely to develop in people who have high  blood pressure readings, are of African descent, or are overweight.  Talk with your health care provider about your target blood pressure readings.  Have your blood pressure checked: ? Every 3-5 years if you are 61-71 years of age. ? Every year if you are 25 years old or older.  If you are between the ages of 58 and 77 and are a current or  former smoker, ask your health care provider if you should have a one-time screening for abdominal aortic aneurysm (AAA). Diabetes Have regular diabetes screenings. This checks your fasting blood sugar level. Have the screening done:  Once every three years after age 60 if you are at a normal weight and have a low risk for diabetes.  More often and at a younger age if you are overweight or have a high risk for diabetes. What should I know about preventing infection? Hepatitis B If you have a higher risk for hepatitis B, you should be screened for this virus. Talk with your health care provider to find out if you are at risk for hepatitis B infection. Hepatitis C Blood testing is recommended for:  Everyone born from [birth year] through 1964.  Anyone with known risk factors for hepatitis C. Sexually transmitted infections (STIs)  You should be screened each year for STIs, including gonorrhea and chlamydia, if: ? You are sexually active and are younger than 49 years of age. ? You are older than 49 years of age and your health care provider tells you that you are at risk for this type of infection. ? Your sexual activity has changed since you were last screened, and you are at increased risk for chlamydia or gonorrhea. Ask your health care provider if you are at risk.  Ask your health care provider about whether you are at high risk for HIV. Your health care provider may recommend a prescription medicine to help prevent HIV infection. If you choose to take medicine to prevent HIV, you should first get tested for HIV. You should then be tested every 3 months for as long as you are taking the medicine. Follow these instructions at home: Lifestyle  Do not use any products that contain nicotine or tobacco, such as cigarettes, e-cigarettes, and chewing tobacco. If you need help quitting, ask your health care provider.  Do not use street drugs.  Do not share needles.  Ask your health care provider  for help if you need support or information about quitting drugs. Alcohol use  Do not drink alcohol if your health care provider tells you not to drink.  If you drink alcohol: ? Limit how much you have to 0-2 drinks a day. ? Be aware of how much alcohol is in your drink. In the [place] one drink equals one 12 oz bottle of beer (355 mL), one 5 oz glass of wine (148 mL), or one 1 oz glass of hard liquor (44 mL). General instructions  Schedule regular health, dental, and eye exams.  Stay current with your vaccines.  Tell your health care provider if: ? You often feel depressed. ? You have ever been abused or do not feel safe at home. Summary  Adopting a healthy lifestyle and getting preventive care are important in promoting health and wellness.  Follow your health care provider's instructions about healthy diet, exercising, and getting tested or screened for diseases.  Follow your health care provider's instructions on monitoring your cholesterol and blood pressure. This information is not intended to replace advice given to you  by your health care provider. Make sure you discuss any questions you have with your health care provider. Document Revised: 03/05/2018 Document Reviewed: 03/05/2018 Elsevier Patient Education  2021 [employer].    [place]. Copelyn Widmer M.D.

## 2020-08-02 ENCOUNTER — Other Ambulatory Visit: Payer: Self-pay

## 2020-08-03 ENCOUNTER — Ambulatory Visit: Payer: 59 | Admitting: Internal Medicine

## 2020-08-03 ENCOUNTER — Encounter: Payer: Self-pay | Admitting: Internal Medicine

## 2020-08-03 VITALS — BP 116/80 | HR 71 | Temp 97.9°F | Ht 69.5 in | Wt 186.5 lb

## 2020-08-03 DIAGNOSIS — Z8616 Personal history of COVID-19: Secondary | ICD-10-CM | POA: Diagnosis not present

## 2020-08-03 DIAGNOSIS — Z1159 Encounter for screening for other viral diseases: Secondary | ICD-10-CM

## 2020-08-03 DIAGNOSIS — Z23 Encounter for immunization: Secondary | ICD-10-CM | POA: Diagnosis not present

## 2020-08-03 DIAGNOSIS — E786 Lipoprotein deficiency: Secondary | ICD-10-CM | POA: Diagnosis not present

## 2020-08-03 DIAGNOSIS — Z0000 Encounter for general adult medical examination without abnormal findings: Secondary | ICD-10-CM | POA: Diagnosis not present

## 2020-08-03 DIAGNOSIS — Z1211 Encounter for screening for malignant neoplasm of colon: Secondary | ICD-10-CM

## 2020-08-03 DIAGNOSIS — Z125 Encounter for screening for malignant neoplasm of prostate: Secondary | ICD-10-CM | POA: Diagnosis not present

## 2020-08-03 LAB — CBC WITH DIFFERENTIAL/PLATELET
Basophils Absolute: 0 10*3/uL (ref 0.0–0.1)
Basophils Relative: 0.5 % (ref 0.0–3.0)
Eosinophils Absolute: 0.1 10*3/uL (ref 0.0–0.7)
Eosinophils Relative: 1.6 % (ref 0.0–5.0)
HCT: 49.5 % (ref 39.0–52.0)
Hemoglobin: 17.1 g/dL — ABNORMAL HIGH (ref 13.0–17.0)
Lymphocytes Relative: 25.3 % (ref 12.0–46.0)
Lymphs Abs: 1.9 10*3/uL (ref 0.7–4.0)
MCHC: 34.6 g/dL (ref 30.0–36.0)
MCV: 86 fl (ref 78.0–100.0)
Monocytes Absolute: 0.4 10*3/uL (ref 0.1–1.0)
Monocytes Relative: 5.3 % (ref 3.0–12.0)
Neutro Abs: 5 10*3/uL (ref 1.4–7.7)
Neutrophils Relative %: 67.3 % (ref 43.0–77.0)
Platelets: 199 10*3/uL (ref 150.0–400.0)
RBC: 5.76 Mil/uL (ref 4.22–5.81)
RDW: 13.2 % (ref 11.5–15.5)
WBC: 7.5 10*3/uL (ref 4.0–10.5)

## 2020-08-03 LAB — HEPATIC FUNCTION PANEL
ALT: 29 U/L (ref 0–53)
AST: 18 U/L (ref 0–37)
Albumin: 4.8 g/dL (ref 3.5–5.2)
Alkaline Phosphatase: 71 U/L (ref 39–117)
Bilirubin, Direct: 0.1 mg/dL (ref 0.0–0.3)
Total Bilirubin: 0.9 mg/dL (ref 0.2–1.2)
Total Protein: 6.9 g/dL (ref 6.0–8.3)

## 2020-08-03 LAB — BASIC METABOLIC PANEL
BUN: 18 mg/dL (ref 6–23)
CO2: 30 mEq/L (ref 19–32)
Calcium: 9.5 mg/dL (ref 8.4–10.5)
Chloride: 103 mEq/L (ref 96–112)
Creatinine, Ser: 1.28 mg/dL (ref 0.40–1.50)
GFR: 65.92 mL/min (ref >60.00)
Glucose, Bld: 95 mg/dL (ref 70–99)
Potassium: 4.4 mEq/L (ref 3.5–5.1)
Sodium: 141 mEq/L (ref 135–145)

## 2020-08-03 LAB — LIPID PANEL
Cholesterol: 193 mg/dL (ref 0–200)
HDL: 36 mg/dL — ABNORMAL LOW (ref >39.00)
LDL Cholesterol: 124 mg/dL — ABNORMAL HIGH (ref 0–99)
NonHDL: 156.81
Total CHOL/HDL Ratio: 5
Triglycerides: 166 mg/dL — ABNORMAL HIGH (ref 0.0–149.0)
VLDL: 33.2 mg/dL (ref 0.0–40.0)

## 2020-08-03 LAB — PSA: PSA: 0.5 ng/mL (ref 0.10–4.00)

## 2020-08-03 LAB — TSH: TSH: 2.19 u[IU]/mL (ref 0.35–4.50)

## 2020-08-03 NOTE — Patient Instructions (Signed)
Attention to healthy lifestyle make sure you get adequate sleep. We will do referral for colonoscopy routine colon cancer screening Tetanus shot update today.     Health Maintenance, Male Adopting a healthy lifestyle and getting preventive care are important in promoting health and wellness. Ask your health care provider about:  The right schedule for you to have regular tests and exams.  Things you can do on your own to prevent diseases and keep yourself healthy. What should I know about diet, weight, and exercise? Eat a healthy diet  Eat a diet that includes plenty of vegetables, fruits, low-fat dairy products, and lean protein.  Do not eat a lot of foods that are high in solid fats, added sugars, or sodium.   Maintain a healthy weight Body mass index (BMI) is a measurement that can be used to identify possible weight problems. It estimates body fat based on height and weight. Your health care provider can help determine your BMI and help you achieve or maintain a healthy weight. Get regular exercise Get regular exercise. This is one of the most important things you can do for your health. Most adults should:  Exercise for at least 150 minutes each week. The exercise should increase your heart rate and make you sweat (moderate-intensity exercise).  Do strengthening exercises at least twice a week. This is in addition to the moderate-intensity exercise.  Spend less time sitting. Even light physical activity can be beneficial. Watch cholesterol and blood lipids Have your blood tested for lipids and cholesterol at 49 years of age, then have this test every 5 years. You may need to have your cholesterol levels checked more often if:  Your lipid or cholesterol levels are high.  You are older than 49 years of age.  You are at high risk for heart disease. What should I know about cancer screening? Many types of cancers can be detected early and may often be prevented. Depending on  your health history and family history, you may need to have cancer screening at various ages. This may include screening for:  Colorectal cancer.  Prostate cancer.  Skin cancer.  Lung cancer. What should I know about heart disease, diabetes, and high blood pressure? Blood pressure and heart disease  High blood pressure causes heart disease and increases the risk of stroke. This is more likely to develop in people who have high blood pressure readings, are of African descent, or are overweight.  Talk with your health care provider about your target blood pressure readings.  Have your blood pressure checked: ? Every 3-5 years if you are 31-41 years of age. ? Every year if you are 49 years old or older.  If you are between the ages of 29 and 54 and are a current or former smoker, ask your health care provider if you should have a one-time screening for abdominal aortic aneurysm (AAA). Diabetes Have regular diabetes screenings. This checks your fasting blood sugar level. Have the screening done:  Once every three years after age 53 if you are at a normal weight and have a low risk for diabetes.  More often and at a younger age if you are overweight or have a high risk for diabetes. What should I know about preventing infection? Hepatitis B If you have a higher risk for hepatitis B, you should be screened for this virus. Talk with your health care provider to find out if you are at risk for hepatitis B infection. Hepatitis C Blood testing is  recommended for:  Everyone born from [birth year] through 1964.  Anyone with known risk factors for hepatitis C. Sexually transmitted infections (STIs)  You should be screened each year for STIs, including gonorrhea and chlamydia, if: ? You are sexually active and are younger than 49 years of age. ? You are older than 49 years of age and your health care provider tells you that you are at risk for this type of infection. ? Your sexual activity has  changed since you were last screened, and you are at increased risk for chlamydia or gonorrhea. Ask your health care provider if you are at risk.  Ask your health care provider about whether you are at high risk for HIV. Your health care provider may recommend a prescription medicine to help prevent HIV infection. If you choose to take medicine to prevent HIV, you should first get tested for HIV. You should then be tested every 3 months for as long as you are taking the medicine. Follow these instructions at home: Lifestyle  Do not use any products that contain nicotine or tobacco, such as cigarettes, e-cigarettes, and chewing tobacco. If you need help quitting, ask your health care provider.  Do not use street drugs.  Do not share needles.  Ask your health care provider for help if you need support or information about quitting drugs. Alcohol use  Do not drink alcohol if your health care provider tells you not to drink.  If you drink alcohol: ? Limit how much you have to 0-2 drinks a day. ? Be aware of how much alcohol is in your drink. In the [place] one drink equals one 12 oz bottle of beer (355 mL), one 5 oz glass of wine (148 mL), or one 1 oz glass of hard liquor (44 mL). General instructions  Schedule regular health, dental, and eye exams.  Stay current with your vaccines.  Tell your health care provider if: ? You often feel depressed. ? You have ever been abused or do not feel safe at home. Summary  Adopting a healthy lifestyle and getting preventive care are important in promoting health and wellness.  Follow your health care provider's instructions about healthy diet, exercising, and getting tested or screened for diseases.  Follow your health care provider's instructions on monitoring your cholesterol and blood pressure. This information is not intended to replace advice given to you by your health care provider. Make sure you discuss any questions you have with your  health care provider. Document Revised: 03/05/2018 Document Reviewed: 03/05/2018 Elsevier Patient Education  2021 [employer].

## 2020-08-03 NOTE — Progress Notes (Signed)
Good cholesterol is on the low side as before bad cholesterol LDL slightly elevated.  Triglycerides borderline elevated with Would intensify healthy diet and activity.  It is advised to improve your cholesterol numbers.  These should be repeated in a year or less Hemoglobin s borderline elevated unclear significance prostate thyroid kidney liver are all normal  If not signed up for my chart please send him a copy of these results. The 10-year ASCVD risk score Denman George DC Montez Hageman., et al., 2013) is: 3.6%   Values used to calculate the score:     Age: 50 years     Sex: Male     Is Non-Hispanic African American: No     Diabetic: No     Tobacco smoker: No     Systolic Blood Pressure: 116 mmHg     Is BP treated: No     HDL Cholesterol: 36 mg/dL     Total Cholesterol: 193 mg/dL

## 2020-08-04 ENCOUNTER — Encounter: Payer: Self-pay | Admitting: *Deleted

## 2020-08-04 LAB — HEPATITIS C ANTIBODY
Hepatitis C Ab: NONREACTIVE
SIGNAL TO CUT-OFF: 0.01 (ref <1.00)

## 2020-08-08 ENCOUNTER — Telehealth: Payer: Self-pay | Admitting: Internal Medicine

## 2020-08-08 NOTE — Telephone Encounter (Signed)
Patient was in the office on May 11th and said he told Mykal that he didn't take Dexamethasone, however it was left in his chart.  He states he has never taken that medication and wants it removed from his chart.

## 2020-08-08 NOTE — Telephone Encounter (Signed)
Medication removed from chart.

## 2021-02-21 ENCOUNTER — Encounter: Payer: Self-pay | Admitting: Gastroenterology

## 2021-05-08 ENCOUNTER — Ambulatory Visit (AMBULATORY_SURGERY_CENTER): Payer: 59 | Admitting: *Deleted

## 2021-05-08 ENCOUNTER — Other Ambulatory Visit: Payer: Self-pay

## 2021-05-08 VITALS — Ht 69.5 in | Wt 180.0 lb

## 2021-05-08 DIAGNOSIS — Z1211 Encounter for screening for malignant neoplasm of colon: Secondary | ICD-10-CM

## 2021-05-08 MED ORDER — PEG 3350-KCL-NA BICARB-NACL 420 G PO SOLR: 4000.0000 mL | Freq: Once | ORAL | 0 refills | Status: AC

## 2021-05-08 NOTE — Progress Notes (Signed)
Patient's pre-visit was done today over the phone with the patient. Name,DOB and address verified. Patient denies any allergies to Eggs and Soy. Patient denies any problems with anesthesia/sedation. Patient is not taking any diet pills or blood thinners. No home Oxygen.  ° °Prep instructions sent to pt's email-pt aware. Patient understands to call us back with any questions or concerns. Patient is aware of our care-partner policy and Covid-19 safety protocol.  ° ° °

## 2021-05-19 ENCOUNTER — Encounter: Payer: Self-pay | Admitting: Gastroenterology

## 2021-05-22 ENCOUNTER — Ambulatory Visit (AMBULATORY_SURGERY_CENTER): Payer: BC Managed Care – PPO | Admitting: Gastroenterology

## 2021-05-22 ENCOUNTER — Encounter: Payer: Self-pay | Admitting: Gastroenterology

## 2021-05-22 VITALS — BP 105/71 | HR 76 | Temp 98.0°F | Resp 11 | Ht 69.5 in | Wt 180.0 lb

## 2021-05-22 DIAGNOSIS — Z1211 Encounter for screening for malignant neoplasm of colon: Secondary | ICD-10-CM

## 2021-05-22 MED ORDER — SODIUM CHLORIDE 0.9 % IV SOLN: 500.0000 mL | Freq: Once | INTRAVENOUS | Status: DC

## 2021-05-22 NOTE — Progress Notes (Signed)
Pt's states no medical or surgical changes since previsit or office visit.   VS taken by CW 

## 2021-05-22 NOTE — Op Note (Signed)
[facility] Patient Name: Sergio Hampton Procedure Date: 05/22/2021 9:25 AM MRN: [id number] Endoscopist: Rachael Fee , MD Age: 51 Referring MD:  Date of Birth: 12-Jul-1970 Gender: Male Account #: [id number] Procedure:                Colonoscopy Indications:              Screening for colorectal malignant neoplasm Medicines:                Monitored Anesthesia Care Procedure:                Pre-Anesthesia Assessment:                           - Prior to the procedure, a History and Physical                            was performed, and patient medications and                            allergies were reviewed. The patient's tolerance of                            previous anesthesia was also reviewed. The risks                            and benefits of the procedure and the sedation                            options and risks were discussed with the patient.                            All questions were answered, and informed consent                            was obtained. Prior Anticoagulants: The patient has                            taken no previous anticoagulant or antiplatelet                            agents. ASA Grade Assessment: II - A patient with                            mild systemic disease. After reviewing the risks                            and benefits, the patient was deemed in                            satisfactory condition to undergo the procedure.                           After obtaining informed consent, the colonoscope  was passed under direct vision. Throughout the                            procedure, the patient's blood pressure, pulse, and                            oxygen saturations were monitored continuously. The                            Olympus CF-HQ190L ([id number]) Colonoscope was                            introduced through the anus and advanced to the the                            cecum, identified by  appendiceal orifice and                            ileocecal valve. The colonoscopy was performed                            without difficulty. The patient tolerated the                            procedure well. The quality of the bowel                            preparation was good. The ileocecal valve,                            appendiceal orifice, and rectum were photographed. Scope In: 9:36:00 AM Scope Out: 9:46:26 AM Scope Withdrawal Time: 0 hours 7 minutes 51 seconds  Total Procedure Duration: 0 hours 10 minutes 26 seconds  Findings:                 Small anal skin tags.                           The exam was otherwise normal throughout the                            examined colon. Complications:            No immediate complications. Estimated blood loss:                            None. Estimated Blood Loss:     Estimated blood loss: none. Impression:               - Small anal skin tags                           - Otherwise normal examination.                           - No polyps or cancers. Recommendation:           -  Patient has a contact number available for                            emergencies. The signs and symptoms of potential                            delayed complications were discussed with the                            patient. Return to normal activities tomorrow.                            Written discharge instructions were provided to the                            patient.                           - Resume previous diet.                           - Continue present medications.                           - Repeat colonoscopy in 10 years for screening                            purposes.                           - Will offer referral to [facility] to consider skin                            tag resection. Rachael Fee, MD 05/22/2021 9:49:17 AM This report has been signed electronically.

## 2021-05-22 NOTE — Patient Instructions (Signed)
Repeat colonoscopy in 10 years for screening purposes.  ° °YOU HAD AN ENDOSCOPIC PROCEDURE TODAY AT THE [facility]:   Refer to the procedure report that was given to you for any specific questions about what was found during the examination.  If the procedure report does not answer your questions, please call your gastroenterologist to clarify.  If you requested that your care partner not be given the details of your procedure findings, then the procedure report has been included in a sealed envelope for you to review at your convenience later. ° °YOU SHOULD EXPECT: Some feelings of bloating in the abdomen. Passage of more gas than usual.  Walking can help get rid of the air that was put into your GI tract during the procedure and reduce the bloating. If you had a lower endoscopy (such as a colonoscopy or flexible sigmoidoscopy) you may notice spotting of blood in your stool or on the toilet paper. If you underwent a bowel prep for your procedure, you may not have a normal bowel movement for a few days. ° °Please Note:  You might notice some irritation and congestion in your nose or some drainage.  This is from the oxygen used during your procedure.  There is no need for concern and it should clear up in a day or so. ° °SYMPTOMS TO REPORT IMMEDIATELY: ° °Following lower endoscopy (colonoscopy or flexible sigmoidoscopy): ° Excessive amounts of blood in the stool ° Significant tenderness or worsening of abdominal pains ° Swelling of the abdomen that is new, acute ° Fever of 100°F or higher ° °For urgent or emergent issues, a gastroenterologist can be reached at any hour by calling [phone]. °Do not use MyChart messaging for urgent concerns.  ° ° °DIET:  We do recommend a small meal at first, but then you may proceed to your regular diet.  Drink plenty of fluids but you should avoid alcoholic beverages for 24 hours. ° °ACTIVITY:  You should plan to take it easy for the rest of today and you should  NOT DRIVE or use heavy machinery until tomorrow (because of the sedation medicines used during the test).   ° °FOLLOW UP: °Our staff will call the number listed on your records 48-72 hours following your procedure to check on you and address any questions or concerns that you may have regarding the information given to you following your procedure. If we do not reach you, we will leave a message.  We will attempt to reach you two times.  During this call, we will ask if you have developed any symptoms of COVID 19. If you develop any symptoms (ie: fever, flu-like symptoms, shortness of breath, cough etc.) before then, please call [phone].  If you test positive for Covid 19 in the 2 weeks post procedure, please call and report this information to us.   ° °If any biopsies were taken you will be contacted by phone or by letter within the next 1-3 weeks.  Please call us at [phone] if you have not heard about the biopsies in 3 weeks.  ° ° °SIGNATURES/CONFIDENTIALITY: °You and/or your care partner have signed paperwork which will be entered into your electronic medical record.  These signatures attest to the fact that that the information above on your After Visit Summary has been reviewed and is understood.  Full responsibility of the confidentiality of this discharge information lies with you and/or your care-partner. ° °

## 2021-05-22 NOTE — Progress Notes (Signed)
To Pacu, VSS. Report to [email or web address] 

## 2021-05-22 NOTE — Progress Notes (Signed)
HPI: This is a man at routine risk for CRC   ROS: complete GI ROS as described in HPI, all other review negative.  Constitutional:  No unintentional weight loss   Past Medical History:  Diagnosis Date   Acute appendicitis 03/20/2011   Arthritis    PONV (postoperative nausea and vomiting)    Struck by horse 09/29/2012    Past Surgical History:  Procedure Laterality Date   COSMETIC SURGERY     on face   CYSTOSCOPY     LAPAROSCOPIC APPENDECTOMY  03/20/2011   Procedure: APPENDECTOMY LAPAROSCOPIC;  Surgeon: Adolph Pollack, MD;  Location: [facility];  Service: General;  Laterality: N/A;    Current Outpatient Medications  Medication Sig Dispense Refill   Ascorbic Acid (VITAMIN C) 100 MG tablet Take 100 mg by mouth daily.     doxycycline (VIBRA-TABS) 100 MG tablet Take 100 mg by mouth 2 (two) times daily.     mupirocin ointment (BACTROBAN) 2 % SMARTSIG:1 Application Topical 2-3 Times Daily     fluticasone (FLONASE) 50 MCG/ACT nasal spray Place into both nostrils daily. (Patient not taking: Reported on 05/08/2021)     Current Facility-Administered Medications  Medication Dose Route Frequency Provider Last Rate Last Admin   0.9 %  sodium chloride infusion  500 mL Intravenous Once Rachael Fee, MD        Allergies as of 05/22/2021   (No Known Allergies)    Family History  Problem Relation Age of Onset   Arthritis Mother    Autoimmune disease Mother        of liver   Atrial fibrillation Father    Thyroid cancer Father    Lung cancer Father    Colon polyps Brother    Asthma Brother    Colon polyps Brother    Colon cancer Neg Hx    Esophageal cancer Neg Hx    Stomach cancer Neg Hx    Rectal cancer Neg Hx     Social History   Socioeconomic History   Marital status: Married    Spouse name: Not on file   Number of children: Not on file   Years of education: Not on file   Highest education level: Not on file  Occupational History   Not on file  Tobacco Use    Smoking status: Never   Smokeless tobacco: Never  Vaping Use   Vaping Use: Never used  Substance and Sexual Activity   Alcohol use: Not Currently   Drug use: No   Sexual activity: Not on file  Other Topics Concern   Not on file  Social History Narrative   HHof 3 wife son pet dog.  Outside  And horses.  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    Neg ETS Firearms        HS education               Social Determinants of Health   Financial Resource Strain: Not on file  Food Insecurity: Not on file  Transportation Needs: Not on file  Physical Activity: Not on file  Stress: Not on file  Social Connections: Not on file  Intimate Partner Violence: Not on file     Physical Exam: BP (!) 141/88    Pulse 77    Temp 98 F (36.7 C)    Ht 5' 9.5" (1.765 m)    Wt 180  lb (81.6 kg)    SpO2 100%    BMI 26.20 kg/m  Constitutional: generally well-appearing Psychiatric: alert and oriented x3 Lungs: CTA bilaterally Heart: no MCR  Assessment and plan: 51 y.o. male with routine risk for crc  Screening colonocopy today  Care is appropriate for the ambulatory setting.  Rob Bunting, MD [facility] 05/22/2021, 8:57 AM

## 2021-05-24 ENCOUNTER — Telehealth: Payer: Self-pay | Admitting: *Deleted

## 2021-05-24 NOTE — Telephone Encounter (Signed)
?  Follow up Call- ? ?Call back number 05/22/2021  ?Post procedure Call Back phone  # [phone]  ?Permission to leave phone message Yes  ?Some recent data might be hidden  ?  ? ?Patient questions: ? ?Do you have a fever, pain , or abdominal swelling? No. ?Pain Score  0 * ? ?Have you tolerated food without any problems? Yes.   ? ?Have you been able to return to your normal activities? Yes.   ? ?Do you have any questions about your discharge instructions: ?Diet   No. ?Medications  No. ?Follow up visit  No. ? ?Do you have questions or concerns about your Care? No. ? ?Actions: ?* If pain score is 4 or above: ?No action needed, pain <4. ? ? ?

## 2021-07-11 ENCOUNTER — Ambulatory Visit: Payer: BC Managed Care – PPO | Admitting: Internal Medicine

## 2023-03-17 ENCOUNTER — Ambulatory Visit (HOSPITAL_BASED_OUTPATIENT_CLINIC_OR_DEPARTMENT_OTHER)
Admission: RE | Admit: 2023-03-17 | Discharge: 2023-03-17 | Disposition: A | Discharge status: Home / Self Care | Payer: BC Managed Care – PPO | Source: Ambulatory Visit | Attending: Internal Medicine | Admitting: Internal Medicine

## 2023-03-17 ENCOUNTER — Encounter (HOSPITAL_BASED_OUTPATIENT_CLINIC_OR_DEPARTMENT_OTHER): Payer: Self-pay

## 2023-03-17 VITALS — BP 123/82 | HR 72 | Temp 98.5°F | Resp 20 | Wt 180.0 lb

## 2023-03-17 DIAGNOSIS — H6992 Unspecified Eustachian tube disorder, left ear: Secondary | ICD-10-CM

## 2023-03-17 DIAGNOSIS — B349 Viral infection, unspecified: Secondary | ICD-10-CM

## 2023-03-17 DIAGNOSIS — R197 Diarrhea, unspecified: Secondary | ICD-10-CM | POA: Diagnosis not present

## 2023-03-17 NOTE — Discharge Instructions (Signed)
Symptoms are likely viral. Eat bland foods such as bananas, toast, rice, and applesauce to bulk up stools and help with diarrhea. Drink plenty of water to stay well-hydrated. Tylenol as needed for abdominal cramping and left ear pain. Continue Flonase. Take Zyrtec once daily at bedtime to help dry up some of the fluid behind eardrums causing ear pain.  Safe travels to [place]!  If you develop any new or worsening symptoms or if your symptoms do not start to improve, please return here or follow-up with your primary care provider. If your symptoms are severe, please go to the emergency room.

## 2023-03-17 NOTE — ED Triage Notes (Signed)
Left ear "popping" back at Thanksgiving. Having left ear fullness. Body aches, low grade fever, diarrhea yesterday.

## 2023-03-17 NOTE — ED Provider Notes (Signed)
[facility]    CSN: [id number] Arrival date & time: 03/17/23  1106      History   Chief Complaint Chief Complaint  Patient presents with   Ear Fullness    I can pop my ear by opening and closing my mouth. Ringing in left ear since Thanksgiving - Entered by patient    HPI Sergio Hampton is a 52 y.o. male.   Patient presents to urgent care for evaluation of fever, chills, left ear pain, and diarrhea.  He started with low-grade fever up to 99.7 3 days ago, fever resolved the next day 2 days ago.  Left ear pain feels like a pressure and "popping".  Yesterday, he states he felt much better but then developed diarrhea yesterday afternoon and diarrhea has been persistent into this morning.  He has had approximately 5 episodes of loose stools yesterday and 1 episode of diarrhea this morning without blood/mucus in the stool.  Denies abdominal cramping and states that he "feels fine".  Denies nausea, vomiting, nasal congestion, cough, dizziness, urinary symptoms, and recent sick contacts with similar symptoms.  No recent water from unknown source, travel, rash, or antibiotic/steroid use.  Using Flonase for left ear pain without much relief.   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 Authorizing Provider  Ascorbic Acid (VITAMIN C) 100 MG tablet Take 100 mg by mouth daily.    [provider]  doxycycline (VIBRA-TABS) 100 MG tablet Take 100 mg by mouth 2 (two) times daily. 05/17/21   [provider]  fluticasone (FLONASE) 50 MCG/ACT nasal spray Place into both nostrils daily. Patient not taking: Reported on 05/08/2021    [provider]  mupirocin ointment (BACTROBAN) 2 % SMARTSIG:1 Application Topical 2-3 Times Daily 05/17/21   [provider]    Family History Family History  Problem Relation Age of Onset   Arthritis Mother    Autoimmune disease Mother        of liver   Atrial fibrillation Father    Thyroid cancer Father    Lung cancer Father    Colon polyps Brother    Asthma Brother    Colon polyps Brother    Colon cancer Neg Hx    Esophageal cancer Neg Hx    Stomach cancer Neg Hx    Rectal cancer Neg Hx     Social History Social History   Tobacco Use   Smoking status: Never   Smokeless tobacco: Never  Vaping Use   Vaping status: Never Used  Substance Use Topics   Alcohol use: Not Currently   Drug use: No     Allergies  Patient has no known allergies.   Review of Systems Review of Systems Per HPI  Physical Exam Triage Vital Signs ED Triage Vitals  Encounter Vitals Group     BP 03/17/23 1134 123/82     Systolic BP Percentile --      Diastolic BP Percentile --      Pulse Rate 03/17/23 1134 72     Resp 03/17/23 1134 20     Temp 03/17/23 1134 98.5 F (36.9 C)     Temp Source 03/17/23 1134 Oral     SpO2 03/17/23 1134 98 %     Weight 03/17/23 1136 180 lb (81.6 kg)     Height --      Head Circumference --      Peak Flow --      Pain Score 03/17/23 1136 0     Pain Loc --      Pain Education --      Exclude from Growth Chart --    No data found.  Updated Vital Signs BP 123/82 (BP Location: Right Arm)   Pulse 72   Temp 98.5 F (36.9 C) (Oral)   Resp  20   Wt 180 lb (81.6 kg)   SpO2 98%   BMI 26.20 kg/m   Visual Acuity Right Eye Distance:   Left Eye Distance:   Bilateral Distance:    Right Eye Near:   Left Eye Near:    Bilateral Near:     Physical Exam Vitals and nursing note reviewed.  Constitutional:      Appearance: He is not ill-appearing or toxic-appearing.  HENT:     Head: Normocephalic and atraumatic.     Right Ear: Hearing, tympanic membrane, ear canal and external ear normal.     Left Ear: Hearing, tympanic membrane, ear canal and external ear normal.     Nose: Nose normal.     Mouth/Throat:     Lips: Pink.     Mouth: Mucous membranes are moist. No injury.     Tongue: No lesions. Tongue does not deviate from midline.     Palate: No mass and lesions.     Pharynx: Oropharynx is clear. Uvula midline. No pharyngeal swelling, oropharyngeal exudate, posterior oropharyngeal erythema or uvula swelling.     Tonsils: No tonsillar exudate or tonsillar abscesses.  Eyes:     General: Lids are normal. Vision grossly intact. Gaze aligned appropriately.     Extraocular Movements: Extraocular movements intact.     Conjunctiva/sclera: Conjunctivae normal.  Cardiovascular:     Rate and Rhythm: Normal rate and regular rhythm.     Heart sounds: Normal heart sounds, S1 normal and S2 normal.  Pulmonary:     Effort: Pulmonary effort is normal. No respiratory distress.     Breath sounds: Normal breath sounds and air entry. No wheezing, rhonchi or rales.  Chest:     Chest wall: No tenderness.  Abdominal:     General: Bowel sounds are normal.     Palpations: Abdomen is soft.     Tenderness: There is no abdominal tenderness. There is no right CVA tenderness, left CVA tenderness or guarding.  Musculoskeletal:     Cervical back: Neck supple.  Skin:    General: Skin is warm and dry.     Capillary Refill: Capillary refill takes less than 2 seconds.     Findings: No rash.  Neurological:     General: No focal deficit present.      Mental Status: He is alert  and oriented to person, place, and time. Mental status is at baseline.     Cranial Nerves: No dysarthria or facial asymmetry.  Psychiatric:        Mood and Affect: Mood normal.        Speech: Speech normal.        Behavior: Behavior normal.        Thought Content: Thought content normal.        Judgment: Judgment normal.      UC Treatments / Results  Labs (all labs ordered are listed, but only abnormal results are displayed) Labs Reviewed - No data to display  EKG   Radiology No results found.  Procedures Procedures (including critical care time)  Medications Ordered in UC Medications - No data to display  Initial Impression / Assessment and Plan / UC Course  I have reviewed the triage vital signs and the nursing notes.  Pertinent labs & imaging results that were available during my care of the patient were reviewed by me and considered in my medical decision making (see chart for details).   1. Viral syndrome, acute dysfunction of left eustachian tube, diarrhea Presentation suspicious for viral syndrome.  No signs of otitis media, suspect eustachian tube dysfunction of the left eardrum. May continue use of Flonase and add on Zyrtec at bedtime as needed. Brat diet recommended to bulk up stools. He does not appear to be dehydrated and I have low suspicion for acute electrolyte abnormality secondary to diarrhea. May use Imodium as needed for diarrhea. Tylenol as needed for aches and pains.  Counseled patient on potential for adverse effects with medications prescribed/recommended today, strict ER and return-to-clinic precautions discussed, patient verbalized understanding.    Final Clinical Impressions(s) / UC Diagnoses   Final diagnoses:  Viral syndrome  Acute dysfunction of left eustachian tube  Diarrhea, unspecified type     Discharge Instructions      Symptoms are likely viral. Eat bland foods such as bananas, toast, rice, and  applesauce to bulk up stools and help with diarrhea. Drink plenty of water to stay well-hydrated. Tylenol as needed for abdominal cramping and left ear pain. Continue Flonase. Take Zyrtec once daily at bedtime to help dry up some of the fluid behind eardrums causing ear pain.  Safe travels to [place]!  If you develop any new or worsening symptoms or if your symptoms do not start to improve, please return here or follow-up with your primary care provider. If your symptoms are severe, please go to the emergency room.      ED Prescriptions   None    PDMP not reviewed this encounter.   Carlisle Beers, [place] 03/17/23 1302

## 2023-04-03 ENCOUNTER — Telehealth: Payer: Self-pay | Admitting: Internal Medicine

## 2023-04-03 NOTE — Telephone Encounter (Signed)
 Called pt to sch physical, left vm. Pls sch pt for appt.
